# Patient Record
Sex: Male | Born: 1940 | Race: White | Hispanic: No | Marital: Married | Smoking: Never smoker
Health system: Southern US, Community
[De-identification: ages and names within clinical notes are randomized; demographics above are authoritative.]

## PROBLEM LIST (undated history)

## (undated) DIAGNOSIS — K219 Gastro-esophageal reflux disease without esophagitis: Secondary | ICD-10-CM

## (undated) DIAGNOSIS — H409 Unspecified glaucoma: Secondary | ICD-10-CM

## (undated) DIAGNOSIS — K589 Irritable bowel syndrome without diarrhea: Secondary | ICD-10-CM

## (undated) DIAGNOSIS — J449 Chronic obstructive pulmonary disease, unspecified: Secondary | ICD-10-CM

## (undated) DIAGNOSIS — J45909 Unspecified asthma, uncomplicated: Secondary | ICD-10-CM

## (undated) DIAGNOSIS — G629 Polyneuropathy, unspecified: Secondary | ICD-10-CM

## (undated) DIAGNOSIS — D1399 Benign neoplasm of ill-defined sites within the digestive system: Secondary | ICD-10-CM

## (undated) DIAGNOSIS — D139 Benign neoplasm of ill-defined sites within the digestive system: Secondary | ICD-10-CM

## (undated) HISTORY — DX: Unspecified glaucoma: H40.9

## (undated) HISTORY — DX: Unspecified asthma, uncomplicated: J45.909

## (undated) HISTORY — DX: Chronic obstructive pulmonary disease, unspecified: J44.9

## (undated) HISTORY — PX: OTHER SURGICAL HISTORY: SHX169

## (undated) HISTORY — DX: Polyneuropathy, unspecified: G62.9

## (undated) HISTORY — DX: Benign neoplasm of ill-defined sites within the digestive system: D13.99

## (undated) HISTORY — DX: Irritable bowel syndrome, unspecified: K58.9

## (undated) HISTORY — DX: Benign neoplasm of ill-defined sites within the digestive system: D13.9

## (undated) HISTORY — DX: Gastro-esophageal reflux disease without esophagitis: K21.9

---

## 1999-03-21 ENCOUNTER — Encounter: Payer: Self-pay | Admitting: Specialist

## 1999-03-21 ENCOUNTER — Ambulatory Visit (HOSPITAL_COMMUNITY): Admission: RE | Admit: 1999-03-21 | Discharge: 1999-03-21 | Payer: Self-pay | Admitting: Specialist

## 2005-01-14 ENCOUNTER — Ambulatory Visit: Payer: Self-pay | Admitting: Internal Medicine

## 2005-02-25 ENCOUNTER — Ambulatory Visit: Payer: Self-pay | Admitting: Internal Medicine

## 2005-05-02 ENCOUNTER — Ambulatory Visit (HOSPITAL_COMMUNITY): Admission: RE | Admit: 2005-05-02 | Discharge: 2005-05-02 | Payer: Self-pay | Admitting: *Deleted

## 2005-06-06 ENCOUNTER — Ambulatory Visit: Payer: Self-pay | Admitting: Internal Medicine

## 2005-09-02 ENCOUNTER — Ambulatory Visit (HOSPITAL_COMMUNITY): Admission: RE | Admit: 2005-09-02 | Discharge: 2005-09-02 | Payer: Self-pay | Admitting: Internal Medicine

## 2005-10-20 DIAGNOSIS — K589 Irritable bowel syndrome without diarrhea: Secondary | ICD-10-CM

## 2005-10-21 ENCOUNTER — Ambulatory Visit: Payer: Self-pay | Admitting: Internal Medicine

## 2005-10-29 ENCOUNTER — Ambulatory Visit: Payer: Self-pay | Admitting: Gastroenterology

## 2005-11-05 ENCOUNTER — Ambulatory Visit: Payer: Self-pay | Admitting: Gastroenterology

## 2005-11-19 ENCOUNTER — Encounter (INDEPENDENT_AMBULATORY_CARE_PROVIDER_SITE_OTHER): Payer: Self-pay | Admitting: *Deleted

## 2005-11-19 ENCOUNTER — Ambulatory Visit: Payer: Self-pay | Admitting: Gastroenterology

## 2005-11-19 DIAGNOSIS — D139 Benign neoplasm of ill-defined sites within the digestive system: Secondary | ICD-10-CM

## 2006-12-04 ENCOUNTER — Ambulatory Visit (HOSPITAL_COMMUNITY): Admission: RE | Admit: 2006-12-04 | Discharge: 2006-12-04 | Payer: Self-pay | Admitting: Internal Medicine

## 2007-03-13 ENCOUNTER — Encounter: Admission: RE | Admit: 2007-03-13 | Discharge: 2007-03-13 | Payer: Self-pay | Admitting: Neurosurgery

## 2007-06-23 ENCOUNTER — Encounter: Admission: RE | Admit: 2007-06-23 | Discharge: 2007-06-23 | Payer: Self-pay | Admitting: Neurosurgery

## 2008-10-07 ENCOUNTER — Telehealth: Payer: Self-pay | Admitting: Gastroenterology

## 2008-10-14 ENCOUNTER — Telehealth: Payer: Self-pay | Admitting: Gastroenterology

## 2008-10-14 ENCOUNTER — Ambulatory Visit: Payer: Self-pay | Admitting: Gastroenterology

## 2008-10-14 DIAGNOSIS — R079 Chest pain, unspecified: Secondary | ICD-10-CM

## 2008-10-14 DIAGNOSIS — J449 Chronic obstructive pulmonary disease, unspecified: Secondary | ICD-10-CM

## 2008-10-14 DIAGNOSIS — K219 Gastro-esophageal reflux disease without esophagitis: Secondary | ICD-10-CM | POA: Insufficient documentation

## 2008-10-14 DIAGNOSIS — J45909 Unspecified asthma, uncomplicated: Secondary | ICD-10-CM | POA: Insufficient documentation

## 2008-10-14 DIAGNOSIS — J4489 Other specified chronic obstructive pulmonary disease: Secondary | ICD-10-CM | POA: Insufficient documentation

## 2008-10-31 ENCOUNTER — Telehealth: Payer: Self-pay | Admitting: Gastroenterology

## 2008-11-01 ENCOUNTER — Ambulatory Visit: Payer: Self-pay | Admitting: Gastroenterology

## 2008-11-09 ENCOUNTER — Telehealth: Payer: Self-pay | Admitting: Gastroenterology

## 2008-11-23 ENCOUNTER — Telehealth: Payer: Self-pay | Admitting: Gastroenterology

## 2009-01-23 ENCOUNTER — Telehealth: Payer: Self-pay | Admitting: Gastroenterology

## 2009-01-27 ENCOUNTER — Ambulatory Visit: Payer: Self-pay | Admitting: Gastroenterology

## 2009-01-30 ENCOUNTER — Ambulatory Visit: Payer: Self-pay | Admitting: Gastroenterology

## 2009-01-30 ENCOUNTER — Telehealth: Payer: Self-pay | Admitting: Gastroenterology

## 2009-01-30 DIAGNOSIS — R142 Eructation: Secondary | ICD-10-CM

## 2009-01-30 DIAGNOSIS — R143 Flatulence: Secondary | ICD-10-CM

## 2009-01-30 DIAGNOSIS — R141 Gas pain: Secondary | ICD-10-CM | POA: Insufficient documentation

## 2009-01-31 ENCOUNTER — Encounter: Payer: Self-pay | Admitting: Gastroenterology

## 2009-09-29 ENCOUNTER — Telehealth: Payer: Self-pay | Admitting: Gastroenterology

## 2010-09-11 ENCOUNTER — Telehealth: Payer: Self-pay | Admitting: Gastroenterology

## 2010-11-29 ENCOUNTER — Telehealth: Payer: Self-pay | Admitting: Gastroenterology

## 2010-12-23 ENCOUNTER — Encounter: Payer: Self-pay | Admitting: Internal Medicine

## 2011-01-01 NOTE — Progress Notes (Signed)
Summary: Triage  Phone Note Call from Patient Call back at Home Phone [phone]   Caller: Patient Call For: Dr. 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 " States he wakes up at 2 AM feeling like he needs to have a BM but cannot go. C/O lower abdominal pain.. Patient  is using Omprazole daily, Bentyl QID as needed.Had a small BM this AM but has not been regular since he took the [employer]. Patient  states he has stopped taking the Equallactin. Instructed patient to try taking Miralax 17 grams this AM and repeat tonight if needed. Patient  will also try Gas-x for gas/bloating. Patient  to call back for further problems or increase in pain.  Jesse Fall, RN  Additional Follow-up for Phone Call Additional follow up Details #1::        ok Additional Follow-up by: Louis Meckel MD,  September 11, 2010 2:42 PM

## 2011-01-03 NOTE — Progress Notes (Signed)
Summary: Triage  Phone Note Call from Patient Call back at Home Phone [phone]   Caller: Patient Call For: Dr. Arlyce Dice Reason for Call: Talk to Nurse Summary of Call: having extreme gas buildup, abd pain Initial call taken by: Karna Christmas,  November 29, 2010 9:18 AM  Follow-up for Phone Call        Spoke with patient and he states he is having a lot of gas and some abdominal pain. Patient states that he is not taking gas-x, miralax, or the align. Instucted patient to use the gas-x for the gas and miralax to help him with bowel movements. Patient has been given all of these instructions in the past but is not following them. Mailed patient diet to follow again. Patient states he will try these recommendations and to call us back if symptoms persist. Follow-up by: Selinda Michaels RN,  November 29, 2010 9:48 AM

## 2011-02-12 ENCOUNTER — Telehealth: Payer: Self-pay | Admitting: Gastroenterology

## 2011-02-19 NOTE — Progress Notes (Signed)
Summary: Triage  Phone Note Call from Patient Call back at Home Phone [phone]   Caller: Marlin Canary Call For: Dr. Arlyce Dice Reason for Call: Talk to Nurse Summary of Call: Seen in ER last night and was DX w/Colitis. Was told to f/u in 1-2 days Initial call taken by: Karna Christmas,  February 12, 2011 8:34 AM  Follow-up for Phone Call        Spoke with patient and offered to make an appointment for the patient to see Mike Gip, PA, patient states he wants to see a "real doctor."Informed pt that this was the best option I have to offer him, they work closely with the physicians and can take care of his needs. Patient states he will call me back with what he wants to do. Follow-up by: Selinda Michaels RN,  February 12, 2011 9:21 AM

## 2011-03-07 ENCOUNTER — Ambulatory Visit (HOSPITAL_COMMUNITY)
Admission: RE | Admit: 2011-03-07 | Discharge: 2011-03-07 | Disposition: A | Discharge status: Home / Self Care | Payer: Medicare Other | Source: Ambulatory Visit | Attending: Physical Medicine and Rehabilitation | Admitting: Physical Medicine and Rehabilitation

## 2011-03-07 ENCOUNTER — Other Ambulatory Visit (HOSPITAL_COMMUNITY): Payer: Self-pay | Admitting: Physical Medicine and Rehabilitation

## 2011-03-07 DIAGNOSIS — Z0189 Encounter for other specified special examinations: Secondary | ICD-10-CM | POA: Insufficient documentation

## 2011-03-07 DIAGNOSIS — Z1810 Retained metal fragments, unspecified: Secondary | ICD-10-CM

## 2011-03-11 ENCOUNTER — Other Ambulatory Visit (HOSPITAL_COMMUNITY): Payer: Self-pay | Admitting: Specialist

## 2011-03-11 ENCOUNTER — Ambulatory Visit (HOSPITAL_COMMUNITY)
Admission: RE | Admit: 2011-03-11 | Discharge: 2011-03-11 | Disposition: A | Discharge status: Home / Self Care | Payer: Medicare Other | Source: Ambulatory Visit | Attending: Specialist | Admitting: Specialist

## 2011-03-11 DIAGNOSIS — Z1389 Encounter for screening for other disorder: Secondary | ICD-10-CM

## 2011-03-11 DIAGNOSIS — Z0189 Encounter for other specified special examinations: Secondary | ICD-10-CM | POA: Insufficient documentation

## 2012-03-24 DIAGNOSIS — R102 Pelvic and perineal pain: Secondary | ICD-10-CM | POA: Insufficient documentation

## 2012-05-26 IMAGING — CR DG ORBITS FOR FOREIGN BODY
2 series · 2 of 2 positions shown · non-contrast
Comparison: 03/07/2011

CLINICAL DATA: Pre MRI - history of metal exposure to eyes several
days ago

ORBITS FOR FOREIGN BODY - 2 VIEW

[w waters (1 of 2)]
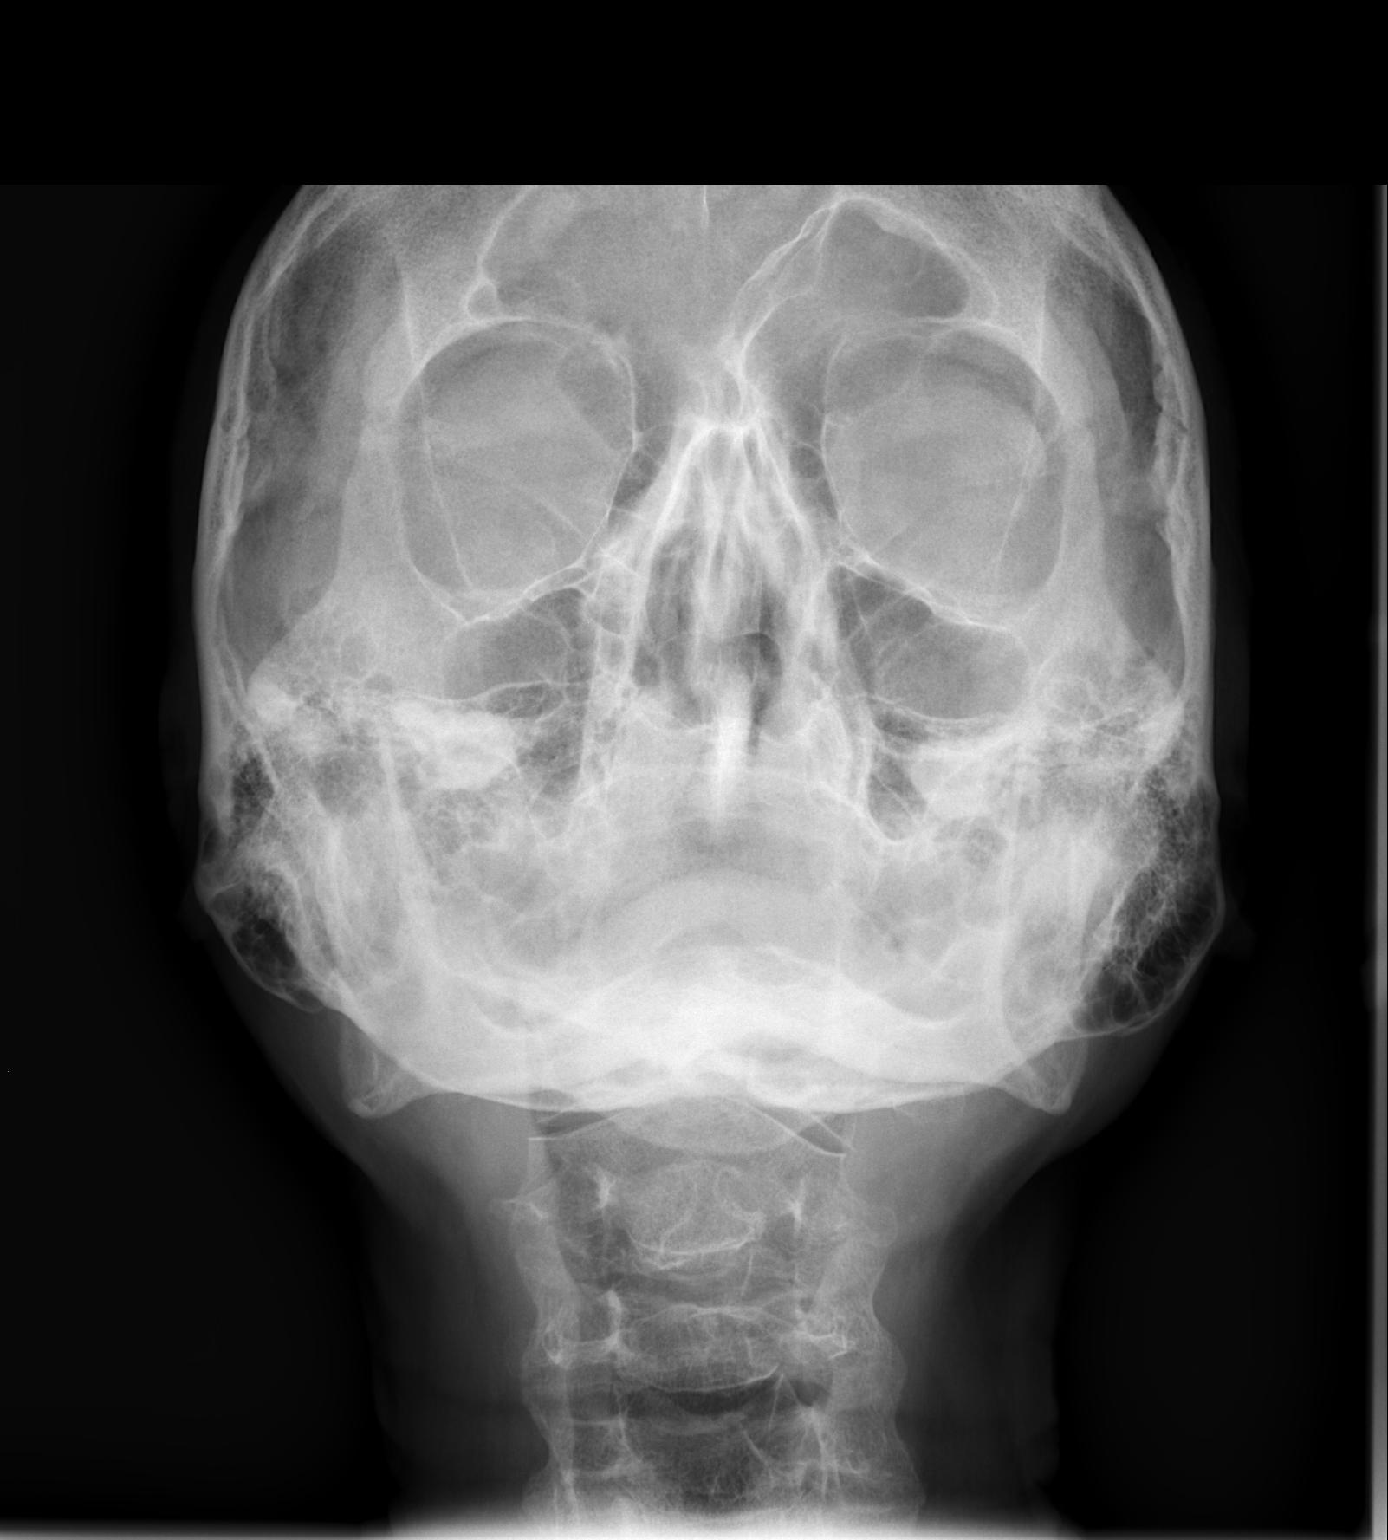

[w waters (2 of 2)]
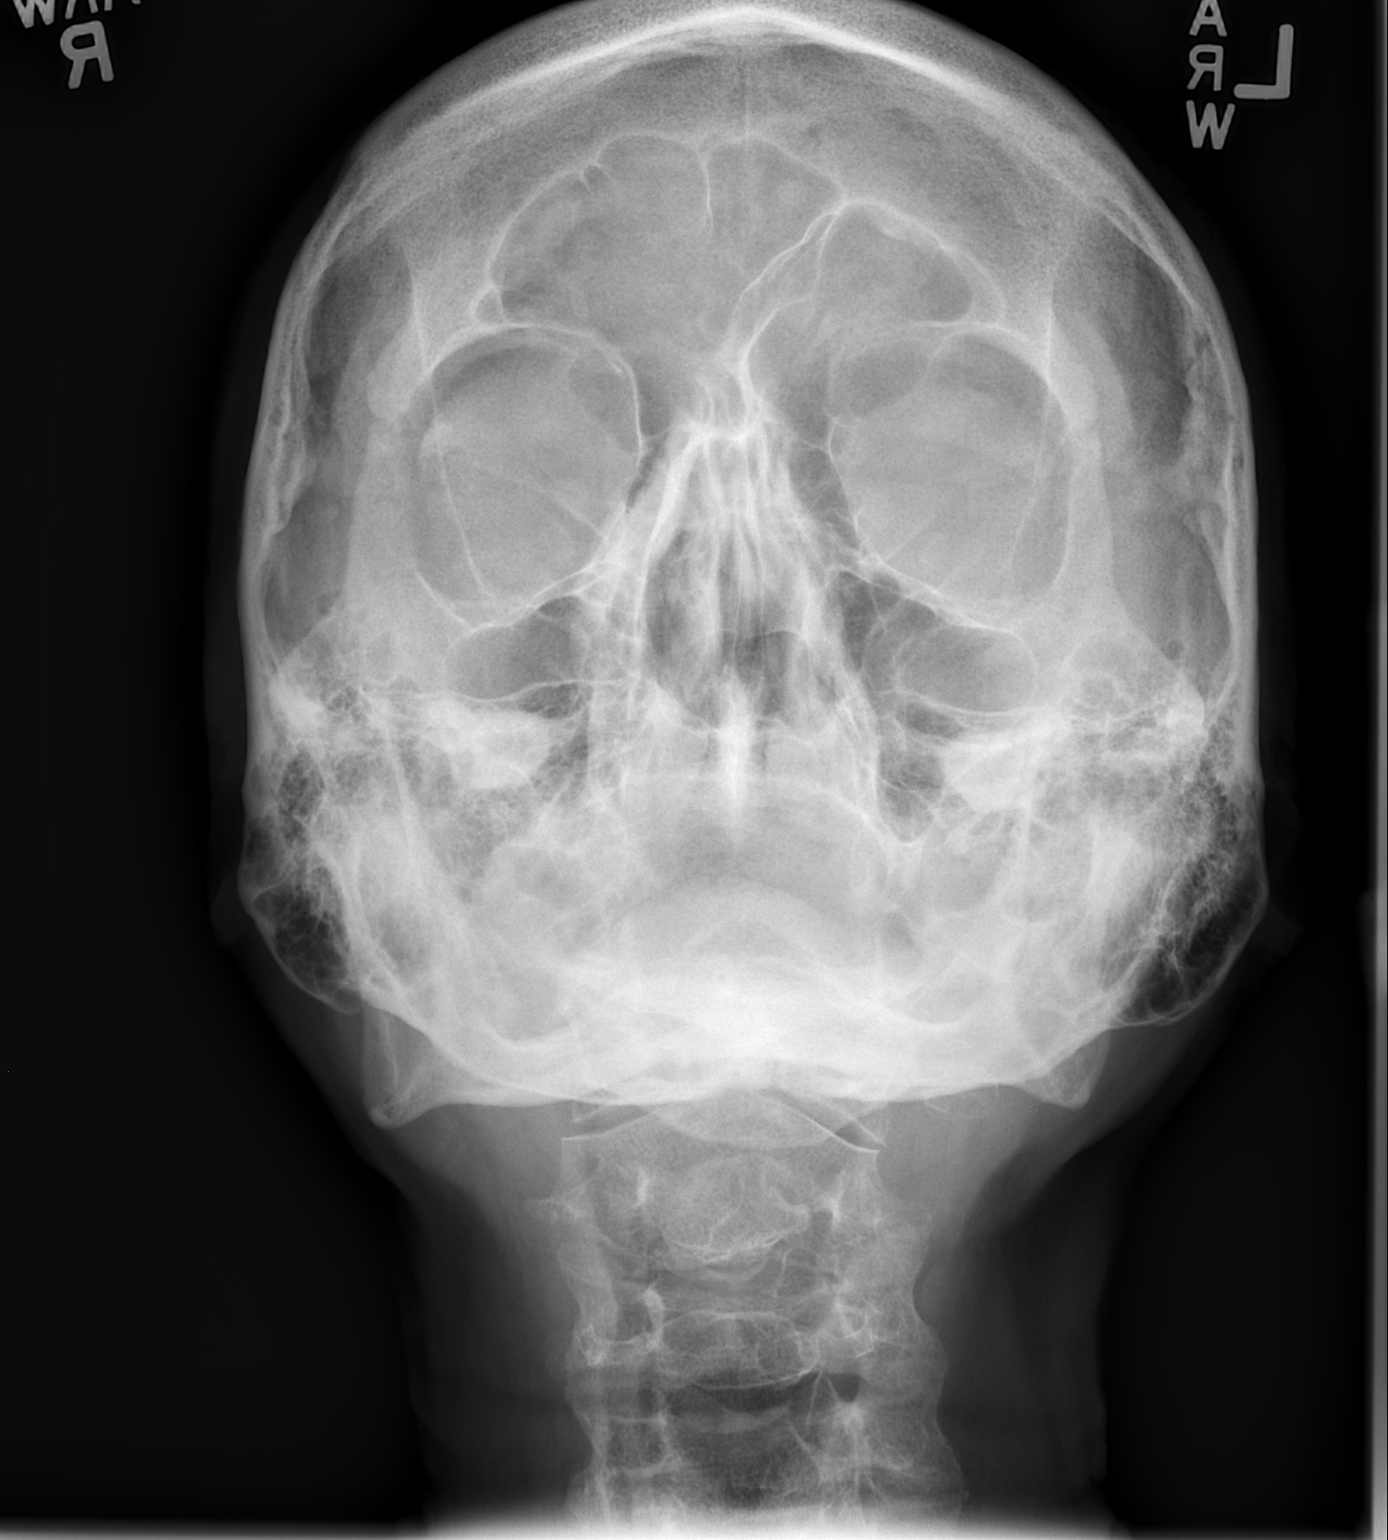

[2 of 2 positions shown; findings below may reference images not displayed]

FINDINGS: No radiopaque foreign body projects over either orbit. No
other pathological findings.
IMPRESSION: No evidence of metallic density foreign body to preclude MRI
scanning.

## 2013-06-10 ENCOUNTER — Telehealth: Payer: Self-pay | Admitting: Gastroenterology

## 2013-06-10 NOTE — Telephone Encounter (Signed)
Pt states he has been eating a lot of tomatoes and nuts and for the past few days he's had right side abdominal pain. Last COLON 11/01/08 showed diverticula in ascending and sigmoid areas. Pt denies diarrhea or a fever. Pt will see Willette Cluster, NP tomorrow.

## 2013-06-11 ENCOUNTER — Other Ambulatory Visit (INDEPENDENT_AMBULATORY_CARE_PROVIDER_SITE_OTHER): Payer: Medicare Other

## 2013-06-11 ENCOUNTER — Telehealth: Payer: Self-pay | Admitting: Nurse Practitioner

## 2013-06-11 ENCOUNTER — Ambulatory Visit (INDEPENDENT_AMBULATORY_CARE_PROVIDER_SITE_OTHER): Payer: Medicare Other | Admitting: Nurse Practitioner

## 2013-06-11 ENCOUNTER — Encounter: Payer: Self-pay | Admitting: Nurse Practitioner

## 2013-06-11 VITALS — BP 100/60 | HR 78 | Ht 70.0 in | Wt 172.0 lb

## 2013-06-11 DIAGNOSIS — G8929 Other chronic pain: Secondary | ICD-10-CM

## 2013-06-11 DIAGNOSIS — R1031 Right lower quadrant pain: Secondary | ICD-10-CM

## 2013-06-11 DIAGNOSIS — K219 Gastro-esophageal reflux disease without esophagitis: Secondary | ICD-10-CM

## 2013-06-11 LAB — CBC WITH DIFFERENTIAL/PLATELET
Eosinophils Absolute: 0.1 10*3/uL (ref 0.0–0.7)
Hemoglobin: 15.7 g/dL (ref 13.0–17.0)
Lymphs Abs: 2 10*3/uL (ref 0.7–4.0)
MCHC: 33.6 g/dL (ref 30.0–36.0)
MCV: 99.9 fl (ref 78.0–100.0)
Monocytes Absolute: 0.5 10*3/uL (ref 0.1–1.0)
Monocytes Relative: 11.9 % (ref 3.0–12.0)
Neutrophils Relative %: 40.8 % — ABNORMAL LOW (ref 43.0–77.0)
Platelets: 154 10*3/uL (ref 150.0–400.0)
RBC: 4.67 Mil/uL (ref 4.22–5.81)
RDW: 12.9 % (ref 11.5–14.6)

## 2013-06-11 MED ORDER — OMEPRAZOLE 20 MG PO CPDR: DELAYED_RELEASE_CAPSULE | ORAL | Status: DC

## 2013-06-11 MED ORDER — OMEPRAZOLE 20 MG PO CPDR: 20.0000 mg | DELAYED_RELEASE_CAPSULE | Freq: Every day | ORAL | Status: DC

## 2013-06-11 NOTE — Telephone Encounter (Signed)
Called patient and left a message for patient to call me back.  He had a question about prilosec.

## 2013-06-11 NOTE — Patient Instructions (Addendum)
Please go to the basement level to have your labs drawn. Start omeprazole once daily 30 minutes before dinner. We sent a prescription to [facility].  Hold milk of magnesia for 2 days to see if this helps loose stool. We made you a follow up appointment with Dr. Arlyce Dice for 07-14-2013 at 945 am. Call for sooner appointment if symptoms are not improving over next few days.  Trial of some simethicone ( Phazyme )  as directed. We have given you some coupons.  You can get this at any pharmacy, [facility], [address], [address], [employer].

## 2013-06-11 NOTE — Progress Notes (Signed)
HPI :  Patient is a 72 year old male known to Dr. Arlyce Dice though he has not been here in a few years. Patient previously evaluated for GERD as well as upper abdominal pain. He had an EGD December 2009 for evaluation of dysphasia and chest pain. Findings included a distal esophageal stricture which was dilated with a Maloney dilator. Exam was otherwise unremarkable. His last colonoscopy, done at time of EGD revealed only diverticulosis.  Patient is here for evaluation of right-sided abdominal pain located just right of umbilicus. Pain started 4 days ago. It occurs mainly at night he doesn't really notice it during the day. He has had a "touch" of this pain before but not to this extent. Bowels have been loose over last few days. Ordinarily he has to take MOM to move his bowels which he is still taking at this point. Apetite is okay overall. Pain got worse after a tomato sandwhich, otherwise he has been able to eat regular diet without increased pain. No new medicines. No fever. No urinary symptoms other than occas hesitancy, on daily Flowmax.  He has tried Bentyl a couple of times but not really sure if it helped. He complains of excessive gas. Patient has been playing a lot of golf lately.   Past Medical History  Diagnosis Date  . Irritable bowel syndrome   . GERD (gastroesophageal reflux disease)   . 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 COPD (chronic obstructive pulmonary disease)   . Unspecified asthma(493.90)    Family History  Problem Relation Age of Onset  . Heart disease    . Diabetes Mother    History  Substance Use Topics  . Smoking status: Never Smoker   . Smokeless tobacco: Never Used  . Alcohol Use: No   Current Outpatient Prescriptions  Medication Sig Dispense Refill  . aspirin 81 MG tablet Take 81 mg by mouth daily.      . diazepam (VALIUM) 5 MG tablet Take 5 mg by mouth every 12 (twelve) hours as needed for anxiety.      . dicyclomine  (BENTYL) 10 MG capsule Take 10 mg by mouth 4 (four) times daily -  before meals and at bedtime.      . fish oil-omega-3 fatty acids 1000 MG capsule Take 2 g by mouth daily.      Marland Kitchen gabapentin (NEURONTIN) 300 MG capsule Take 300 mg by mouth 3 (three) times daily.      Marland Kitchen HYDROcodone-acetaminophen (NORCO/VICODIN) 5-325 MG per tablet Take 1 tablet by mouth every 6 (six) hours as needed for pain.      . Multiple Vitamins-Minerals (CENTRUM SILVER PO) Take by mouth.      . tamsulosin (FLOMAX) 0.4 MG CAPS Take by mouth daily after supper.       No current facility-administered medications for this visit.    Review of Systems: Positive for allergies, anxiety, arthritis, back pain, fatigue, headaches, sleeping problems and hearing problems. All other  systems reviewed and negative except where noted in HPI.   Physical Exam: BP 100/60  Pulse 78  Ht 5\' 10"  (1.778 m)  Wt 172 lb (78.019 kg)  BMI 24.68 kg/m2  SpO2 98% Constitutional: Pleasant,well-developed, white male in no acute distress. HEENT: Normocephalic and atraumatic. Conjunctivae are normal. No scleral icterus. Neck supple.  Cardiovascular: Normal rate, regular rhythm.  Pulmonary/chest: Effort normal and breath sounds normal. No wheezing, rales or rhonchi. Abdominal: Soft, nondistended, nontender. Bowel sounds active throughout. There are  no masses palpable. No hepatomegaly. Negative carnett's sign.  Extremities: no edema Lymphadenopathy: No cervical adenopathy noted. Neurological: Alert and oriented to person place and time. Skin: Skin is warm and dry. No rashes noted. Psychiatric: Flat affect.   ASSESSMENT AND PLAN:  1. Right mid abdominal pain, not really related to meals or defecation. Etiology not clear if. Abdominal exam is unremarkable. Stools slightly more loose than normal. Pain maybe IBS related. Musculoskeletal pain not excluded. Abdominal exam benign. Check CBC today. Follow up with Dr. Arlyce Dice in 2-3 weeks. If pain worsens in  the interim patient will call the office for further testing.   2. GERD, off PPI for a year, now with intermittent heartburn at night. and occasional solid food dysphagia. Advised restarting Prilosec 30 minutes prior to dinner. Continue to elevate HOB,   3. Loose stool, hold MOM for a couple of days.    4. Excessive flatus, yesterday he started daily Activia. Trial of phazyme.

## 2013-06-11 NOTE — Telephone Encounter (Signed)
Per Logan Bores the patient can take 40 mg of Prilosec before dinner daily.  Also, per Gunnar Fusi the patient's labs ( CBC ) were normal.  I advised him of this.

## 2013-06-14 NOTE — Progress Notes (Signed)
Reviewed and agree with management. Saxon Barich D. Xoe Hoe, M.D., FACG  

## 2013-07-14 ENCOUNTER — Ambulatory Visit: Payer: Medicare Other | Admitting: Gastroenterology

## 2013-09-13 ENCOUNTER — Telehealth: Payer: Self-pay | Admitting: Gastroenterology

## 2013-09-13 NOTE — Telephone Encounter (Signed)
Pt states he has had some problems with acid reflux. Pt states he was taking omeprazole but stopped taking it. Instructed pt to resume taking the omeprazole and to call us back if that did not help. Pt verbalized understanding.

## 2013-10-20 ENCOUNTER — Telehealth: Payer: Self-pay | Admitting: Gastroenterology

## 2013-10-20 NOTE — Telephone Encounter (Signed)
Pt states he is having problems with lots of gas. Discussed with pt that he should be taking his prilosec daily and that he could try phazyme or gas-x. Pt would like to be seen by Dr. Arlyce Dice. Pt scheduled to see Dr. Arlyce Dice Friday at 10:45am. Pt aware of appt.

## 2013-10-22 ENCOUNTER — Ambulatory Visit: Payer: Medicare Other | Admitting: Gastroenterology

## 2013-12-10 ENCOUNTER — Encounter: Payer: Self-pay | Admitting: Gastroenterology

## 2013-12-10 ENCOUNTER — Telehealth: Payer: Self-pay | Admitting: Gastroenterology

## 2013-12-10 ENCOUNTER — Ambulatory Visit (INDEPENDENT_AMBULATORY_CARE_PROVIDER_SITE_OTHER): Payer: Medicare HMO | Admitting: Gastroenterology

## 2013-12-10 VITALS — BP 98/62 | HR 70 | Ht 70.0 in | Wt 159.8 lb

## 2013-12-10 DIAGNOSIS — R1031 Right lower quadrant pain: Secondary | ICD-10-CM

## 2013-12-10 DIAGNOSIS — K219 Gastro-esophageal reflux disease without esophagitis: Secondary | ICD-10-CM

## 2013-12-10 DIAGNOSIS — R109 Unspecified abdominal pain: Secondary | ICD-10-CM

## 2013-12-10 DIAGNOSIS — G8929 Other chronic pain: Secondary | ICD-10-CM

## 2013-12-10 NOTE — Assessment & Plan Note (Signed)
Pain is very nonspecific.  He describes this as different from his intermittent left lower quadrant pain that is responsive to Bentyl.  This is probably due to musculoskeletal pain.  Recommendations #1 stool Hemoccults #2 the patient was instructed to contact me if pain should worsen

## 2013-12-10 NOTE — Telephone Encounter (Signed)
Pt called and states that he forgot to mention at his office visit that at times he has a bad taste in his mouth and some nausea. Pt wanted to make sure he let Dr. Deatra Ina know. Dr. Deatra Ina notified.

## 2013-12-10 NOTE — Patient Instructions (Signed)
Go to the basement today for a Hemoccult kit  Follow up in one month  Use Zegerid OTC one every night at bedtime

## 2013-12-10 NOTE — Progress Notes (Signed)
          History of Present Illness:  Mr. Dennis Hoffman has returned for evaluation of right lower quadrant abdominal pain.  He has very mild, nonspecific discomfort in the right lower quadrant.  It is unrelated to moving his bowels or urinating.  There are no exacerbating or ameliorating factors.  He denies change in bowel habits or rectal bleeding.  He complains of nocturnal pyrosis.  He's been taking omeprazole and ranitidine.  He denies dysphagia.    Review of Systems: Pertinent positive and negative review of systems were noted in the above HPI section. All other review of systems were otherwise negative.    Current Medications, Allergies, Past Medical History, Past Surgical History, Family History and Social History were reviewed in [facility] record  Vital signs were reviewed in today's medical record. Physical Exam: General: Well developed , well nourished, no acute distress Skin: anicteric Head: Normocephalic and atraumatic Eyes:  sclerae anicteric, EOMI Ears: Normal auditory acuity Mouth: No deformity or lesions Lungs: Clear throughout to auscultation Heart: Regular rate and rhythm; no murmurs, rubs or bruits Abdomen: Soft, non tender and non distended. No masses, hepatosplenomegaly or hernias noted. Normal Bowel sounds Rectal:deferred Musculoskeletal: Symmetrical with no gross deformities  Pulses:  Normal pulses noted Extremities: No clubbing, cyanosis, edema or deformities noted Neurological: Alert oriented x 4, grossly nonfocal Psychological:  Alert and cooperative. Normal mood and affect  See Assessment and Plan under Problem List

## 2013-12-10 NOTE — Assessment & Plan Note (Signed)
He is moderately symptomatic, especially at night.  Recommendations #1 trial of Zegerid 40 mg each bedtime

## 2013-12-13 NOTE — Telephone Encounter (Signed)
Noted  

## 2013-12-14 ENCOUNTER — Other Ambulatory Visit (INDEPENDENT_AMBULATORY_CARE_PROVIDER_SITE_OTHER): Payer: Medicare HMO

## 2013-12-14 DIAGNOSIS — R109 Unspecified abdominal pain: Secondary | ICD-10-CM

## 2013-12-14 LAB — FECAL OCCULT BLOOD, IMMUNOCHEMICAL: Fecal Occult Bld: NEGATIVE

## 2013-12-15 NOTE — Progress Notes (Signed)
Quick Note:  Please inform the patient that hemeoccult was normal and to continue current plan of action ______

## 2014-08-05 ENCOUNTER — Encounter: Payer: Self-pay | Admitting: Gastroenterology

## 2014-11-30 ENCOUNTER — Ambulatory Visit (INDEPENDENT_AMBULATORY_CARE_PROVIDER_SITE_OTHER): Payer: Commercial Managed Care - HMO | Admitting: Neurology

## 2014-11-30 ENCOUNTER — Encounter: Payer: Self-pay | Admitting: Neurology

## 2014-11-30 VITALS — BP 109/60 | HR 83 | Ht 71.0 in | Wt 163.0 lb

## 2014-11-30 DIAGNOSIS — R519 Headache, unspecified: Secondary | ICD-10-CM | POA: Insufficient documentation

## 2014-11-30 DIAGNOSIS — R42 Dizziness and giddiness: Secondary | ICD-10-CM | POA: Insufficient documentation

## 2014-11-30 DIAGNOSIS — R51 Headache: Secondary | ICD-10-CM

## 2014-11-30 DIAGNOSIS — G44209 Tension-type headache, unspecified, not intractable: Secondary | ICD-10-CM

## 2014-11-30 MED ORDER — GABAPENTIN 300 MG PO CAPS: 300.0000 mg | ORAL_CAPSULE | Freq: Three times a day (TID) | ORAL | Status: DC

## 2014-11-30 MED ORDER — TRAMADOL HCL 50 MG PO TABS: 50.0000 mg | ORAL_TABLET | Freq: Two times a day (BID) | ORAL | Status: DC | PRN

## 2014-11-30 NOTE — Progress Notes (Signed)
PATIENT: Dennis Hoffman DOB: 1940/12/12  HISTORICAL  Dennis Hoffman is a 73 yo right-handed male, is referred by his primary care physician Dr. Delena Bali, accompanied by his wife for evaluation of new onset headaches, dizziness  In early December 2015, without clear trigger event, he noticed headaches, at bilateral supraorbital area, sometimes radiating to worse the right frontal, temporal region, pounding, pressure, lasting for few hours, temporary relief by ibuprofen, always come back later, he denies visual loss, he was recently diagnosed with left eye glaucoma, had a previous left eye injury in the past, is receiving eyedrops, but denies left visual loss,  In addition she complains of intermittent lightheadedness, especially when he is standing up, denies vertigo, he denies gait difficulty, no chewing difficulty, no diffuse muscle achy pain, no fever   REVIEW OF SYSTEMS: Full 14 system review of systems performed and notable only for eye pain, memory loss, headaches, dizziness, joints pain, allergy, constipation, urination problem, incontinence, hearing loss, ringing ears   ALLERGIES: No Known Allergies  HOME MEDICATIONS: Current Outpatient Prescriptions on File Prior to Visit  Medication Sig Dispense Refill  . aspirin 81 MG tablet Take 81 mg by mouth daily.    . diazepam (VALIUM) 5 MG tablet Take 5 mg by mouth every 12 (twelve) hours as needed for anxiety.    . dicyclomine (BENTYL) 10 MG capsule Take 10 mg by mouth 4 (four) times daily -  before meals and at bedtime.    . fish oil-omega-3 fatty acids 1000 MG capsule Take 2 g by mouth daily.    Marland Kitchen HYDROcodone-acetaminophen (NORCO/VICODIN) 5-325 MG per tablet Take 1 tablet by mouth every 6 (six) hours as needed for pain.    . Multiple Vitamins-Minerals (CENTRUM SILVER PO) Take by mouth.    Marland Kitchen omeprazole (PRILOSEC) 20 MG capsule Take 40 mg by mouth daily. Take 1 capsule before dinner.    . tamsulosin (FLOMAX) 0.4 MG CAPS Take by mouth  daily after supper.    . gabapentin (NEURONTIN) 300 MG capsule Take 300 mg by mouth 3 (three) times daily.     No current facility-administered medications on file prior to visit.    PAST MEDICAL HISTORY: Past Medical History  Diagnosis Date  . Irritable bowel syndrome   . GERD (gastroesophageal reflux disease)   . 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 Unspecified asthma(493.90)     PAST SURGICAL HISTORY: Past Surgical History  Procedure Laterality Date  . Back sugery    . Toe sugery      FAMILY HISTORY: Family History  Problem Relation Age of Onset  . Heart disease    . Diabetes Mother     SOCIAL HISTORY:  History   Social History  . Marital Status: Married    Spouse Name: N/A    Number of Children: 1  . Years of Education: N/A   Occupational History  . retired    Social History Main Topics  . Smoking status: Never Smoker   . Smokeless tobacco: Never Used  . Alcohol Use: No  . Drug Use: No  . Sexual Activity: Not on file   Other Topics Concern  . Not on file   Social History Narrative     PHYSICAL EXAM   Filed Vitals:   11/30/14 0917  BP: 109/60  Pulse: 83  Height: '5\' 11"'  (1.803 m)  Weight: 163 lb (73.936 kg)    Not recorded  Body mass index is 22.74 kg/(m^2).   Generalized: In no acute distress  Neck: Supple, no carotid bruits   Cardiac: Regular rate rhythm  Pulmonary: Clear to auscultation bilaterally  Musculoskeletal: No deformity  Neurological examination  Mentation: Alert oriented to time, place, history taking, and causual conversation  Cranial nerve II-XII: Pupils were equal round reactive to light. Extraocular movements were full.  Visual field were full on confrontational test. Bilateral fundi were sharp.  Facial sensation and strength were normal. Hearing was intact to finger rubbing bilaterally. Uvula tongue midline.  Head turning and shoulder shrug and were normal and  symmetric.Tongue protrusion into cheek strength was normal.  Motor: Normal tone, bulk and strength.  Sensory: Intact to fine touch, pinprick, preserved vibratory sensation, and proprioception at toes.  Coordination: Normal finger to nose, heel-to-shin bilaterally there was no truncal ataxia  Gait: Rising up from seated position without assistance, normal stance, without trunk ataxia, moderate stride, good arm swing, smooth turning, able to perform tiptoe, and heel walking without difficulty.   Romberg signs: Negative  Deep tendon reflexes: Brachioradialis 2/2, biceps 2/2, triceps 2/2, patellar 3/3, Achilles 2/2, plantar responses were flexor bilaterally.   DIAGNOSTIC DATA (LABS, IMAGING, TESTING) - I reviewed patient records, labs, notes, testing and imaging myself where available.  Lab Results  Component Value Date   WBC 4.4* 06/11/2013   HGB 15.7 06/11/2013   HCT 46.7 06/11/2013   MCV 99.9 06/11/2013   PLT 154.0 06/11/2013   ASSESSMENT AND PLAN  Dennis Hoffman is a 73 y.o. male presented with new onset headaches, dizziness, suggestive of orthostatic blood pressure changes, normal neurological examination, with exception of hyperreflexia of bilateral lower extremity  1, laboratory evaluations including ESR, C-reactive protein, to rule out temporal arteritis 2, MRI of brain 3. Refill gabapentin 300 mg 3 times a day, tramadol as needed 4, return to clinic in 3-4 weeks  Marcial Pacas, M.D. Ph.D.  [facility] [address] [place], [place] [address] [phone]

## 2014-12-01 LAB — CBC WITH DIFFERENTIAL
BASOS ABS: 0 10*3/uL (ref 0.0–0.2)
Basos: 1 %
EOS: 1 %
Eosinophils Absolute: 0 10*3/uL (ref 0.0–0.4)
HEMATOCRIT: 43.7 % (ref 37.5–51.0)
Hemoglobin: 15.2 g/dL (ref 12.6–17.7)
IMMATURE GRANS (ABS): 0 10*3/uL (ref 0.0–0.1)
IMMATURE GRANULOCYTES: 0 %
LYMPHS ABS: 2 10*3/uL (ref 0.7–3.1)
LYMPHS: 51 %
MCH: 32.7 pg (ref 26.6–33.0)
MCHC: 34.8 g/dL (ref 31.5–35.7)
MCV: 94 fL (ref 79–97)
MONOCYTES: 13 %
Monocytes Absolute: 0.5 10*3/uL (ref 0.1–0.9)
NEUTROS ABS: 1.4 10*3/uL (ref 1.4–7.0)
Neutrophils Relative %: 34 %
PLATELETS: 195 10*3/uL (ref 150–379)
RBC: 4.65 x10E6/uL (ref 4.14–5.80)
RDW: 12.7 % (ref 12.3–15.4)
WBC: 4 10*3/uL (ref 3.4–10.8)

## 2014-12-01 LAB — COMPREHENSIVE METABOLIC PANEL
A/G RATIO: 2 (ref 1.1–2.5)
ALK PHOS: 82 IU/L (ref 39–117)
ALT: 28 IU/L (ref 0–44)
AST: 27 IU/L (ref 0–40)
Albumin: 4.8 g/dL (ref 3.5–4.8)
BILIRUBIN TOTAL: 0.7 mg/dL (ref 0.0–1.2)
BUN/Creatinine Ratio: 14 (ref 10–22)
BUN: 12 mg/dL (ref 8–27)
CO2: 26 mmol/L (ref 18–29)
Calcium: 10.1 mg/dL (ref 8.6–10.2)
Chloride: 101 mmol/L (ref 97–108)
Creatinine, Ser: 0.85 mg/dL (ref 0.76–1.27)
GFR, EST AFRICAN AMERICAN: 101 mL/min/{1.73_m2} (ref >59)
GFR, EST NON AFRICAN AMERICAN: 87 mL/min/{1.73_m2} (ref >59)
Globulin, Total: 2.4 g/dL (ref 1.5–4.5)
Glucose: 90 mg/dL (ref 65–99)
POTASSIUM: 4.3 mmol/L (ref 3.5–5.2)
SODIUM: 142 mmol/L (ref 134–144)
Total Protein: 7.2 g/dL (ref 6.0–8.5)

## 2014-12-01 LAB — SEDIMENTATION RATE: SED RATE: 3 mm/h (ref 0–30)

## 2014-12-01 LAB — ANA W/REFLEX IF POSITIVE: ANA: NEGATIVE

## 2014-12-01 LAB — TSH: TSH: 1.73 u[IU]/mL (ref 0.450–4.500)

## 2014-12-01 LAB — C-REACTIVE PROTEIN: CRP: 0.1 mg/L (ref 0.0–4.9)

## 2014-12-05 NOTE — Progress Notes (Signed)
Quick Note:  Called and left patient a message normal labs. ______

## 2014-12-14 ENCOUNTER — Ambulatory Visit
Admission: RE | Admit: 2014-12-14 | Discharge: 2014-12-14 | Disposition: A | Discharge status: Home / Self Care | Payer: Medicare Other | Source: Ambulatory Visit | Attending: Neurology | Admitting: Neurology

## 2014-12-14 DIAGNOSIS — R42 Dizziness and giddiness: Secondary | ICD-10-CM

## 2014-12-14 DIAGNOSIS — G44209 Tension-type headache, unspecified, not intractable: Secondary | ICD-10-CM

## 2014-12-15 ENCOUNTER — Telehealth: Payer: Self-pay | Admitting: Neurology

## 2014-12-15 NOTE — Telephone Encounter (Signed)
Donna/Michelle:  Please call patient, mild age related MRI findings, no significant abnormalities  Please check also check on his headaches, dizziness, he should have a follow-up appointment with me in 3-4 weeks  Mildly abnormal MRI brain (without) demonstrating: 1. Minimal small foci of periventricular and subcortical T2 hyperintensities. These findings are non-specific and considerations include autoimmune, inflammatory, post-infectious, microvascular ischemic or migraine associated etiologies.  2. No acute findings. 3. 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

## 2014-12-15 NOTE — Telephone Encounter (Signed)
Left message

## 2014-12-16 NOTE — Telephone Encounter (Signed)
FYI Dr.Yan

## 2014-12-16 NOTE — Telephone Encounter (Signed)
Patient returning call regarding MRI results. He will be available to answer this morning at [phone]. Okay to leave a message at this number also.

## 2014-12-16 NOTE — Telephone Encounter (Signed)
Spoke to patient about MRI who verbalized understanding of results.  His dizziness has improved.  Daily headaches still present that vary in severity but responding well to at home treatment (Tramadol, NSAIDS).  Scheduled a follow up appointment with you on 01/09/14. Crissie Sickles

## 2015-01-02 DIAGNOSIS — H01021 Squamous blepharitis right upper eyelid: Secondary | ICD-10-CM | POA: Diagnosis not present

## 2015-01-02 DIAGNOSIS — H11153 Pinguecula, bilateral: Secondary | ICD-10-CM | POA: Diagnosis not present

## 2015-01-02 DIAGNOSIS — H4011X2 Primary open-angle glaucoma, moderate stage: Secondary | ICD-10-CM | POA: Diagnosis not present

## 2015-01-02 DIAGNOSIS — H01022 Squamous blepharitis right lower eyelid: Secondary | ICD-10-CM | POA: Diagnosis not present

## 2015-01-02 DIAGNOSIS — H10413 Chronic giant papillary conjunctivitis, bilateral: Secondary | ICD-10-CM | POA: Diagnosis not present

## 2015-01-09 ENCOUNTER — Ambulatory Visit: Payer: Self-pay | Admitting: Neurology

## 2015-01-20 DIAGNOSIS — M5136 Other intervertebral disc degeneration, lumbar region: Secondary | ICD-10-CM | POA: Diagnosis not present

## 2015-01-20 DIAGNOSIS — E785 Hyperlipidemia, unspecified: Secondary | ICD-10-CM | POA: Diagnosis not present

## 2015-01-20 DIAGNOSIS — Z0000 Encounter for general adult medical examination without abnormal findings: Secondary | ICD-10-CM | POA: Diagnosis not present

## 2015-01-20 DIAGNOSIS — Z1389 Encounter for screening for other disorder: Secondary | ICD-10-CM | POA: Diagnosis not present

## 2015-01-20 DIAGNOSIS — Z125 Encounter for screening for malignant neoplasm of prostate: Secondary | ICD-10-CM | POA: Diagnosis not present

## 2015-01-20 DIAGNOSIS — N400 Enlarged prostate without lower urinary tract symptoms: Secondary | ICD-10-CM | POA: Diagnosis not present

## 2015-01-20 DIAGNOSIS — Z9181 History of falling: Secondary | ICD-10-CM | POA: Diagnosis not present

## 2015-01-20 DIAGNOSIS — R9431 Abnormal electrocardiogram [ECG] [EKG]: Secondary | ICD-10-CM | POA: Diagnosis not present

## 2015-01-20 DIAGNOSIS — F419 Anxiety disorder, unspecified: Secondary | ICD-10-CM | POA: Diagnosis not present

## 2015-03-06 DIAGNOSIS — H1011 Acute atopic conjunctivitis, right eye: Secondary | ICD-10-CM | POA: Diagnosis not present

## 2015-04-26 DIAGNOSIS — G588 Other specified mononeuropathies: Secondary | ICD-10-CM | POA: Diagnosis not present

## 2015-05-17 DIAGNOSIS — G588 Other specified mononeuropathies: Secondary | ICD-10-CM | POA: Diagnosis not present

## 2015-05-17 DIAGNOSIS — N508 Other specified disorders of male genital organs: Secondary | ICD-10-CM | POA: Diagnosis not present

## 2015-07-18 ENCOUNTER — Other Ambulatory Visit: Payer: Self-pay | Admitting: Neurology

## 2015-08-02 DIAGNOSIS — F419 Anxiety disorder, unspecified: Secondary | ICD-10-CM | POA: Diagnosis not present

## 2015-08-02 DIAGNOSIS — Z23 Encounter for immunization: Secondary | ICD-10-CM | POA: Diagnosis not present

## 2015-08-02 DIAGNOSIS — Z139 Encounter for screening, unspecified: Secondary | ICD-10-CM | POA: Diagnosis not present

## 2015-08-02 DIAGNOSIS — K589 Irritable bowel syndrome without diarrhea: Secondary | ICD-10-CM | POA: Diagnosis not present

## 2015-08-02 DIAGNOSIS — M5136 Other intervertebral disc degeneration, lumbar region: Secondary | ICD-10-CM | POA: Diagnosis not present

## 2015-08-02 DIAGNOSIS — N400 Enlarged prostate without lower urinary tract symptoms: Secondary | ICD-10-CM | POA: Diagnosis not present

## 2015-08-02 DIAGNOSIS — Z9181 History of falling: Secondary | ICD-10-CM | POA: Diagnosis not present

## 2015-08-02 DIAGNOSIS — Z1389 Encounter for screening for other disorder: Secondary | ICD-10-CM | POA: Diagnosis not present

## 2015-08-04 DIAGNOSIS — E785 Hyperlipidemia, unspecified: Secondary | ICD-10-CM | POA: Diagnosis not present

## 2015-08-28 DIAGNOSIS — G588 Other specified mononeuropathies: Secondary | ICD-10-CM | POA: Diagnosis not present

## 2015-09-22 DIAGNOSIS — Z23 Encounter for immunization: Secondary | ICD-10-CM | POA: Diagnosis not present

## 2015-10-19 ENCOUNTER — Ambulatory Visit (INDEPENDENT_AMBULATORY_CARE_PROVIDER_SITE_OTHER): Payer: Medicare Other | Admitting: Allergy and Immunology

## 2015-10-19 ENCOUNTER — Encounter: Payer: Self-pay | Admitting: Allergy and Immunology

## 2015-10-19 VITALS — BP 108/68 | HR 88 | Temp 98.0°F | Resp 18 | Ht 69.29 in | Wt 170.9 lb

## 2015-10-19 DIAGNOSIS — H1010 Acute atopic conjunctivitis, unspecified eye: Secondary | ICD-10-CM

## 2015-10-19 DIAGNOSIS — J309 Allergic rhinitis, unspecified: Secondary | ICD-10-CM | POA: Diagnosis not present

## 2015-10-19 DIAGNOSIS — L718 Other rosacea: Secondary | ICD-10-CM | POA: Diagnosis not present

## 2015-10-19 MED ORDER — AZELASTINE-FLUTICASONE 137-50 MCG/ACT NA SUSP: 1.0000 | Freq: Two times a day (BID) | NASAL | Status: DC

## 2015-10-19 MED ORDER — DOXYCYCLINE HYCLATE 100 MG PO CAPS: 100.0000 mg | ORAL_CAPSULE | Freq: Every day | ORAL | Status: DC

## 2015-10-19 MED ORDER — AZELASTINE-FLUTICASONE 137-50 MCG/ACT NA SUSP: 1.0000 | Freq: Every day | NASAL | Status: DC

## 2015-10-19 NOTE — Progress Notes (Signed)
[place]    NEW PATIENT NOTE  Referring Provider: Nicoletta Dress, MD Primary Provider: Nicoletta Dress, MD    Subjective:   Patient ID: Dennis Hoffman is a 74 y.o. male with a chief complaint of Conjunctivitis and Nasal Congestion  and the following problems:  HPI Comments:  Dennis Hoffman presents this clinic in evaluation of allergies. He has a long history of allergies involving his nose with sneezing and nasal congestion especially following exposure to perfumes and dusts. What bothers him most recently is his left eye produces a lot of mucus. He seen a ophthalmologist about this issue and in fact has seen several ophthalmologists about this issue who have told him that he may have glaucoma. They've given him several eyedrops including pazeo eyedrops and prednisone eyedrops and Alaway eyedrops and he thinks that some of these do help somewhat. He does rub his eye on a pretty consistent basis. It should be noted that he complains a lot about his eyelashes being intensely itchy. He does get red face for a commonly. He does flush.   Past Medical History  Diagnosis Date  . Irritable bowel syndrome   . GERD (gastroesophageal reflux disease)   . 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 COPD (chronic obstructive pulmonary disease) ([place])   . Unspecified asthma(493.90)     Past Surgical History  Procedure Laterality Date  . Back sugery    . Toe sugery      Outpatient Encounter Prescriptions as of 10/19/2015  Medication Sig  . diazepam (VALIUM) 5 MG tablet Take 5 mg by mouth every 12 (twelve) hours as needed for anxiety.  . dicyclomine (BENTYL) 10 MG capsule Take 10 mg by mouth 4 (four) times daily -  before meals and at bedtime.  . fexofenadine (ALLEGRA) 180 MG tablet Take 180 mg by mouth as needed.  . fish oil-omega-3 fatty acids 1000 MG capsule Take 2 g by mouth daily.  . fluticasone  (FLONASE) 50 MCG/ACT nasal spray Place 1 spray into both nostrils daily.  Marland Kitchen gabapentin (NEURONTIN) 300 MG capsule TAKE ONE CAPSULE BY MOUTH THREE TIMES DAILY  . HYDROcodone-acetaminophen (NORCO/VICODIN) 5-325 MG per tablet Take 1 tablet by mouth every 6 (six) hours as needed for pain.  Marland Kitchen ketotifen (ALAWAY) 0.025 % ophthalmic solution 1 drop 2 (two) times daily.  . Multiple Vitamins-Minerals (CENTRUM SILVER PO) Take by mouth.  Marland Kitchen omeprazole (PRILOSEC) 20 MG capsule Take 40 mg by mouth daily. Take 1 capsule before dinner.  . ranitidine (ZANTAC) 150 MG tablet Take 150 mg by mouth 2 (two) times daily.  . tamsulosin (FLOMAX) 0.4 MG CAPS Take by mouth daily after supper.  Marland Kitchen aspirin 81 MG tablet Take 81 mg by mouth daily.  . bimatoprost (LUMIGAN) 0.01 % SOLN Place 1 drop into the left eye at bedtime.  . Hypromellose (SYSTANE OVERNIGHT THERAPY) 0.3 % GEL Apply to eye as needed.  . montelukast (SINGULAIR) 10 MG tablet Take 10 mg by mouth at bedtime.  . Olopatadine HCl (PAZEO) 0.7 % SOLN Apply to eye as directed.  . traMADol (ULTRAM) 50 MG tablet Take 1 tablet (50 mg total) by mouth every 12 (twelve) hours as needed. (Patient not taking: Reported on 10/19/2015)  . Travoprost, BAK Free, (TRAVATAN Z) 0.004 % SOLN ophthalmic solution Place 1 drop into the left eye at bedtime.   No facility-administered encounter medications on file as of 10/19/2015.  No orders of the defined types were placed in this encounter.    No Known Allergies  Review of Systems  Constitutional: Negative for fever and chills.  HENT: Positive for congestion and sneezing. Negative for ear pain, facial swelling, nosebleeds, postnasal drip, rhinorrhea, sinus pressure, sore throat, tinnitus, trouble swallowing and voice change.   Eyes: Positive for redness and itching. Negative for pain and discharge.  Respiratory: Negative for cough, choking, chest tightness, shortness of breath, wheezing and stridor.   Cardiovascular: Negative  for chest pain and leg swelling.  Gastrointestinal: Positive for abdominal pain. Negative for nausea and vomiting.       History of irritable bowel syndrome.  Endocrine: Negative for cold intolerance and heat intolerance.  Genitourinary: Negative for difficulty urinating.  Musculoskeletal: Positive for back pain. Negative for myalgias and arthralgias.       Persistent lower back pain from disc disease briefly treated with surgery  Allergic/Immunologic: Negative.   Neurological: Negative for dizziness.  Hematological: Negative for adenopathy.    Family History  Problem Relation Age of Onset  . Heart disease    . Diabetes Mother     Social History   Social History  . Marital Status: Married    Spouse Name: N/A  . Number of Children: N/A  . Years of Education: N/A   Occupational History  . retired    Social History Main Topics  . Smoking status: Never Smoker   . Smokeless tobacco: Never Used  . Alcohol Use: No  . Drug Use: No  . Sexual Activity: Not on file   Other Topics Concern  . Not on file   Social History Narrative    Environmental and Social history  Lives in a house with a dry environment, no animals located inside the household, no carpeting in the bedroom, sleeping on a stuff mattress without any plastic for the bed or pillows, and no smokers located inside the household.   Objective:   Filed Vitals:   10/19/15 0907  BP: 108/68  Pulse: 88  Temp: 98 F (36.7 C)  Resp: 18   Height: 5' 9.29" (176 cm) Weight: 170 lb 13.7 oz (77.5 kg)  Physical Exam  Constitutional: He appears well-developed and well-nourished. No distress.  Blowing nose  HENT:  Head: Normocephalic and atraumatic. Head is without right periorbital erythema and without left periorbital erythema.  Right Ear: Tympanic membrane, external ear and ear canal normal. No drainage or tenderness. No foreign bodies. Tympanic membrane is not injected, not scarred, not perforated, not erythematous,  not retracted and not bulging. No middle ear effusion.  Left Ear: Tympanic membrane, external ear and ear canal normal. No drainage or tenderness. No foreign bodies. Tympanic membrane is not injected, not scarred, not perforated, not erythematous, not retracted and not bulging.  No middle ear effusion.  Nose: Mucosal edema present. No rhinorrhea, nose lacerations or sinus tenderness.  No foreign bodies.  Mouth/Throat: Oropharynx is clear and moist. No oropharyngeal exudate, posterior oropharyngeal edema, posterior oropharyngeal erythema or tonsillar abscesses.  Eyes: Lids are normal. Right eye exhibits no chemosis, no discharge and no exudate. No foreign body present in the right eye. Left eye exhibits no chemosis, no discharge and no exudate. No foreign body present in the left eye. Right conjunctiva is injected. Left conjunctiva is injected.  Neck: Neck supple. No tracheal tenderness present. No tracheal deviation and no edema present. No thyroid mass and no thyromegaly present.  Cardiovascular: Normal rate, regular rhythm, S1 normal and S2 normal.  Exam reveals no gallop.   No murmur heard. Pulmonary/Chest: No accessory muscle usage or stridor. No respiratory distress. He has no wheezes. He has no rhonchi. He has no rales.  Abdominal: Soft. He exhibits no distension and no mass. There is no tenderness. There is no rebound and no guarding.  Lymphadenopathy:       Head (right side): No tonsillar adenopathy present.       Head (left side): No tonsillar adenopathy present.    He has no cervical adenopathy.  Neurological: He is alert.  Skin: Rash noted. He is not diaphoretic.  Diffuse facial erythema with slight induration and small gland ectasia and papule formation with a slightly bulbous nose  Psychiatric: He has a normal mood and affect. His behavior is normal.    Diagnostics:  Allergy skin tests were performed. He demonstrated hypersensitivity to dust mite, cat, molds, and red cedar  tree  Assessment and Plan:    1. Allergic rhinoconjunctivitis   2. Ocular rosacea      1. Allergen avoidance measures  2. Do not touch or rub eye  3. Doxycycline 100 mg one tablet one time per day  4. Try Dymista one spray each nostril one time per day. Replaces fluticasone spray  5. Can use OTC antihistamine  6. Can use OTC Systane eye drops or gel-drops several times per day  7. Return in 4 weeks  I will make the assumption that [name] has a combination of allergic rhinoconjunctivitis and ocular rosacea giving rise to his ranitidine and eye symptoms and treat him with the therapy mentioned above and have him return to this clinic in a possibly 4 weeks for an evaluation.    Allena Katz, MD [place]

## 2015-10-19 NOTE — Patient Instructions (Addendum)
  1. Allergen avoidance measures  2. Do not touch or rub eye  3. Doxycycline 100 mg one tablet one time per day  4. Try Dymista one spray each nostril two time per day. Replaces fluticasone spray  5. Can use OTC antihistamine  6. Can use OTC Systane eye drops or gel-drops several times per day  7. Return in 4 weeks

## 2015-10-20 ENCOUNTER — Other Ambulatory Visit: Payer: Self-pay | Admitting: *Deleted

## 2015-10-20 ENCOUNTER — Telehealth: Payer: Self-pay | Admitting: Allergy and Immunology

## 2015-10-20 MED ORDER — AZELASTINE HCL 0.15 % NA SOLN: 1.0000 | Freq: Two times a day (BID) | NASAL | Status: DC

## 2015-10-20 NOTE — Telephone Encounter (Signed)
Dr. Neldon Mc prescribed Dymista. It is not covered under his insurance. Needs something else called in that will be covered.  [facility] in [place]

## 2015-10-20 NOTE — Telephone Encounter (Signed)
LM FOR PATIENT ADVISING WILL SEND RX FOR AZELASTINE TO ADD TO HIS FLONASE ONE SPRAY EACH NOSTRIL TWICE DAILY OF BOTH, RX SENT

## 2015-10-21 DIAGNOSIS — M961 Postlaminectomy syndrome, not elsewhere classified: Secondary | ICD-10-CM | POA: Diagnosis not present

## 2015-10-21 DIAGNOSIS — M5136 Other intervertebral disc degeneration, lumbar region: Secondary | ICD-10-CM | POA: Diagnosis not present

## 2015-11-08 DIAGNOSIS — M5136 Other intervertebral disc degeneration, lumbar region: Secondary | ICD-10-CM | POA: Diagnosis not present

## 2015-11-20 ENCOUNTER — Ambulatory Visit: Payer: Medicare Other | Admitting: Allergy and Immunology

## 2015-12-11 ENCOUNTER — Ambulatory Visit (INDEPENDENT_AMBULATORY_CARE_PROVIDER_SITE_OTHER): Payer: Medicare Other | Admitting: Allergy and Immunology

## 2015-12-11 ENCOUNTER — Encounter: Payer: Self-pay | Admitting: Allergy and Immunology

## 2015-12-11 VITALS — BP 100/68 | HR 68 | Resp 16

## 2015-12-11 DIAGNOSIS — J309 Allergic rhinitis, unspecified: Secondary | ICD-10-CM | POA: Diagnosis not present

## 2015-12-11 DIAGNOSIS — H1010 Acute atopic conjunctivitis, unspecified eye: Secondary | ICD-10-CM

## 2015-12-11 DIAGNOSIS — L718 Other rosacea: Secondary | ICD-10-CM | POA: Diagnosis not present

## 2015-12-11 MED ORDER — METRONIDAZOLE 0.75 % EX CREA: TOPICAL_CREAM | CUTANEOUS | Status: DC

## 2015-12-11 NOTE — Progress Notes (Signed)
[facility]  Follow-up Note  Referring Provider: Nicoletta Dress, MD Primary Provider: Nicoletta Dress, MD Date of Office Visit: 12/11/2015  Subjective:   Dennis Hoffman is a 75 y.o. male who returns to the [place] in re-evaluation of the following:  HPI Comments:  Dennis Hoffman returns to this clinic on 12/11/2015 in reevaluation of his allergic rhinoconjunctivitis and ocular rosacea. He states that he is about 70% improved with a still has some problems on his left eye and still occasionally rubs his left eye. He stopped his doxycycline last week. He could not afford his Dymista and he's back on nasal fluticasone spray. He's had very little problems with his nose at this point. He did develop a headache over the course of the past 2 days that is apparently resolved today.   Current Outpatient Prescriptions on File Prior to Visit  Medication Sig Dispense Refill  . Azelastine HCl 0.15 % SOLN Place 1 spray into the nose 2 (two) times daily. 30 mL 5  . bimatoprost (LUMIGAN) 0.01 % SOLN Place 1 drop into the left eye at bedtime.    . diazepam (VALIUM) 5 MG tablet Take 5 mg by mouth every 12 (twelve) hours as needed for anxiety.    . dicyclomine (BENTYL) 10 MG capsule Take 10 mg by mouth 4 (four) times daily -  before meals and at bedtime.    . fexofenadine (ALLEGRA) 180 MG tablet Take 180 mg by mouth as needed.    . fish oil-omega-3 fatty acids 1000 MG capsule Take 2 g by mouth daily.    . fluticasone (FLONASE) 50 MCG/ACT nasal spray Place 1 spray into both nostrils daily.    Marland Kitchen gabapentin (NEURONTIN) 300 MG capsule TAKE ONE CAPSULE BY MOUTH THREE TIMES DAILY 30 capsule 0  . HYDROcodone-acetaminophen (NORCO/VICODIN) 5-325 MG per tablet Take 1 tablet by mouth every 6 (six) hours as needed for pain.    . Hypromellose (SYSTANE OVERNIGHT THERAPY) 0.3 % GEL Apply to eye as needed.    Marland Kitchen ketotifen (ALAWAY) 0.025 % ophthalmic  solution 1 drop 2 (two) times daily.    . Multiple Vitamins-Minerals (CENTRUM SILVER PO) Take by mouth.    Marland Kitchen omeprazole (PRILOSEC) 20 MG capsule Take 40 mg by mouth daily. Take 1 capsule before dinner.    . ranitidine (ZANTAC) 150 MG tablet Take 150 mg by mouth 2 (two) times daily.    . tamsulosin (FLOMAX) 0.4 MG CAPS Take by mouth daily after supper.    . Travoprost, BAK Free, (TRAVATAN Z) 0.004 % SOLN ophthalmic solution Place 1 drop into the left eye at bedtime.    Marland Kitchen aspirin 81 MG tablet Take 81 mg by mouth daily. Reported on 12/11/2015    . Azelastine-Fluticasone 137-50 MCG/ACT SUSP Place 1 spray into the nose 2 (two) times daily. (Patient not taking: Reported on 12/11/2015) 23 g 5  . doxycycline (VIBRAMYCIN) 100 MG capsule Take 1 capsule (100 mg total) by mouth daily. (Patient not taking: Reported on 12/11/2015) 30 capsule 3  . montelukast (SINGULAIR) 10 MG tablet Take 10 mg by mouth at bedtime. Reported on 12/11/2015    . Olopatadine HCl (PAZEO) 0.7 % SOLN Apply to eye as directed. Reported on 12/11/2015    . traMADol (ULTRAM) 50 MG tablet Take 1 tablet (50 mg total) by mouth every 12 (twelve) hours as needed. (Patient not taking: Reported on 10/19/2015) 60 tablet 5   No current facility-administered  medications on file prior to visit.    Meds ordered this encounter  Medications  . metroNIDAZOLE (METROCREAM) 0.75 % cream    Sig: Apply to face as directed two times a day.    Dispense:  45 g    Refill:  3    Past Medical History  Diagnosis Date  . Irritable bowel syndrome   . GERD (gastroesophageal reflux disease)   . 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 COPD (chronic obstructive pulmonary disease) ([place])   . Unspecified asthma(493.90)     Past Surgical History  Procedure Laterality Date  . Back sugery    . Toe sugery      No Known Allergies  Review of systems negative except as noted in HPI / PMHx or noted below:  Review of Systems   Constitutional: Negative.   HENT: Negative.   Eyes: Negative.   Respiratory: Negative.   Cardiovascular: Negative.   Gastrointestinal: Negative.   Genitourinary: Negative.   Musculoskeletal: Negative.   Skin: Negative.   Neurological: Negative.   Endo/Heme/Allergies: Negative.   Psychiatric/Behavioral: Negative.      Objective:   Filed Vitals:   12/11/15 1004  BP: 100/68  Pulse: 68  Resp: 16          Physical Exam  Constitutional: He is well-developed, well-nourished, and in no distress. No distress.  HENT:  Head: Normocephalic.  Right Ear: Tympanic membrane, external ear and ear canal normal.  Left Ear: Tympanic membrane, external ear and ear canal normal.  Nose: Nose normal. No mucosal edema or rhinorrhea.  Mouth/Throat: Uvula is midline, oropharynx is clear and moist and mucous membranes are normal. No oropharyngeal exudate.  Eyes: Conjunctivae are normal.  Neck: Trachea normal. No tracheal tenderness present. No tracheal deviation present. No thyromegaly present.  Cardiovascular: Normal rate, regular rhythm, S1 normal, S2 normal and normal heart sounds.   No murmur heard. Pulmonary/Chest: Breath sounds normal. No stridor. No respiratory distress. He has no wheezes. He has no rales.  Musculoskeletal: He exhibits no edema.  Lymphadenopathy:       Head (right side): No tonsillar adenopathy present.       Head (left side): No tonsillar adenopathy present.    He has no cervical adenopathy.    He has no axillary adenopathy.  Neurological: He is alert. Gait normal.  Skin: Rash (facial erythema) noted. He is not diaphoretic. No erythema. Nails show no clubbing.  Psychiatric: Mood and affect normal.    Diagnostics: None  Assessment and Plan:   1. Allergic rhinoconjunctivitis   2. Ocular rosacea      1. Allergen avoidance measures - DUST MITE  2. Do not touch or rub eye  3. Doxycycline 100 mg one tablet one time per day  4.  Continue fluticasone nasal  spray  5. Start metrocream apply to face two times per day  6. Can use OTC Systane eye drops or gel-drops and OTC antihistamine several times per day  7. Return in 12 weeks or earlier if problem  Dennis Hoffman is doing better and I've encouraged him to consistently use doxycycline and will add in MetroCream and see him back in this clinic and possibly 12 weeks to make a determination about how to proceed pending his response. I've also encouraged him to perform house dust avoidance measures especially with in his bedroom. He would be a candidate for immunotherapy if he fails medical therapy.  Allena Katz, MD [facility]

## 2015-12-11 NOTE — Patient Instructions (Signed)
  1. Allergen avoidance measures - DUST MITE  2. Do not touch or rub eye  3. Doxycycline 100 mg one tablet one time per day  4.  Continue fluticasone nasal spray  5. Start metrocream apply to face two times per day  6. Can use OTC Systane eye drops or gel-drops and OTC antihistamine several times per day  7. Return in 12 weeks or earlier if problem

## 2016-02-02 DIAGNOSIS — Z125 Encounter for screening for malignant neoplasm of prostate: Secondary | ICD-10-CM | POA: Diagnosis not present

## 2016-02-02 DIAGNOSIS — M5136 Other intervertebral disc degeneration, lumbar region: Secondary | ICD-10-CM | POA: Diagnosis not present

## 2016-02-02 DIAGNOSIS — K219 Gastro-esophageal reflux disease without esophagitis: Secondary | ICD-10-CM | POA: Diagnosis not present

## 2016-02-02 DIAGNOSIS — K581 Irritable bowel syndrome with constipation: Secondary | ICD-10-CM | POA: Diagnosis not present

## 2016-02-02 DIAGNOSIS — E785 Hyperlipidemia, unspecified: Secondary | ICD-10-CM | POA: Diagnosis not present

## 2016-02-02 DIAGNOSIS — N400 Enlarged prostate without lower urinary tract symptoms: Secondary | ICD-10-CM | POA: Diagnosis not present

## 2016-02-29 DIAGNOSIS — G588 Other specified mononeuropathies: Secondary | ICD-10-CM | POA: Diagnosis not present

## 2016-02-29 DIAGNOSIS — M5136 Other intervertebral disc degeneration, lumbar region: Secondary | ICD-10-CM | POA: Diagnosis not present

## 2016-02-29 DIAGNOSIS — R399 Unspecified symptoms and signs involving the genitourinary system: Secondary | ICD-10-CM | POA: Diagnosis not present

## 2016-02-29 DIAGNOSIS — J309 Allergic rhinitis, unspecified: Secondary | ICD-10-CM | POA: Diagnosis not present

## 2016-04-01 DIAGNOSIS — J45901 Unspecified asthma with (acute) exacerbation: Secondary | ICD-10-CM | POA: Diagnosis not present

## 2016-04-01 DIAGNOSIS — Z6824 Body mass index (BMI) 24.0-24.9, adult: Secondary | ICD-10-CM | POA: Diagnosis not present

## 2016-04-01 DIAGNOSIS — J309 Allergic rhinitis, unspecified: Secondary | ICD-10-CM | POA: Diagnosis not present

## 2016-04-10 DIAGNOSIS — K921 Melena: Secondary | ICD-10-CM | POA: Diagnosis not present

## 2016-04-10 DIAGNOSIS — K5792 Diverticulitis of intestine, part unspecified, without perforation or abscess without bleeding: Secondary | ICD-10-CM | POA: Diagnosis not present

## 2016-04-10 DIAGNOSIS — Z6824 Body mass index (BMI) 24.0-24.9, adult: Secondary | ICD-10-CM | POA: Diagnosis not present

## 2016-04-15 DIAGNOSIS — K219 Gastro-esophageal reflux disease without esophagitis: Secondary | ICD-10-CM | POA: Diagnosis not present

## 2016-06-18 DIAGNOSIS — L570 Actinic keratosis: Secondary | ICD-10-CM | POA: Diagnosis not present

## 2016-06-18 DIAGNOSIS — L578 Other skin changes due to chronic exposure to nonionizing radiation: Secondary | ICD-10-CM | POA: Diagnosis not present

## 2016-06-18 DIAGNOSIS — C44319 Basal cell carcinoma of skin of other parts of face: Secondary | ICD-10-CM | POA: Diagnosis not present

## 2016-06-26 DIAGNOSIS — B309 Viral conjunctivitis, unspecified: Secondary | ICD-10-CM | POA: Diagnosis not present

## 2016-06-28 DIAGNOSIS — J019 Acute sinusitis, unspecified: Secondary | ICD-10-CM | POA: Diagnosis not present

## 2016-06-28 DIAGNOSIS — Z6824 Body mass index (BMI) 24.0-24.9, adult: Secondary | ICD-10-CM | POA: Diagnosis not present

## 2016-06-28 DIAGNOSIS — J329 Chronic sinusitis, unspecified: Secondary | ICD-10-CM | POA: Diagnosis not present

## 2016-08-09 DIAGNOSIS — Z1389 Encounter for screening for other disorder: Secondary | ICD-10-CM | POA: Diagnosis not present

## 2016-08-09 DIAGNOSIS — Z139 Encounter for screening, unspecified: Secondary | ICD-10-CM | POA: Diagnosis not present

## 2016-08-09 DIAGNOSIS — K581 Irritable bowel syndrome with constipation: Secondary | ICD-10-CM | POA: Diagnosis not present

## 2016-08-09 DIAGNOSIS — Z9181 History of falling: Secondary | ICD-10-CM | POA: Diagnosis not present

## 2016-08-09 DIAGNOSIS — M4806 Spinal stenosis, lumbar region: Secondary | ICD-10-CM | POA: Diagnosis not present

## 2016-08-09 DIAGNOSIS — E785 Hyperlipidemia, unspecified: Secondary | ICD-10-CM | POA: Diagnosis not present

## 2016-08-09 DIAGNOSIS — N400 Enlarged prostate without lower urinary tract symptoms: Secondary | ICD-10-CM | POA: Diagnosis not present

## 2016-09-18 DIAGNOSIS — J45901 Unspecified asthma with (acute) exacerbation: Secondary | ICD-10-CM | POA: Diagnosis not present

## 2016-09-18 DIAGNOSIS — J3089 Other allergic rhinitis: Secondary | ICD-10-CM | POA: Diagnosis not present

## 2016-09-27 DIAGNOSIS — G588 Other specified mononeuropathies: Secondary | ICD-10-CM | POA: Diagnosis not present

## 2016-10-11 DIAGNOSIS — Z23 Encounter for immunization: Secondary | ICD-10-CM | POA: Diagnosis not present

## 2016-10-14 DIAGNOSIS — D1720 Benign lipomatous neoplasm of skin and subcutaneous tissue of unspecified limb: Secondary | ICD-10-CM | POA: Diagnosis not present

## 2016-10-14 DIAGNOSIS — J45901 Unspecified asthma with (acute) exacerbation: Secondary | ICD-10-CM | POA: Diagnosis not present

## 2016-10-14 DIAGNOSIS — Z6824 Body mass index (BMI) 24.0-24.9, adult: Secondary | ICD-10-CM | POA: Diagnosis not present

## 2016-11-14 ENCOUNTER — Ambulatory Visit (INDEPENDENT_AMBULATORY_CARE_PROVIDER_SITE_OTHER): Payer: Medicare Other | Admitting: Allergy and Immunology

## 2016-11-14 ENCOUNTER — Encounter: Payer: Self-pay | Admitting: Allergy and Immunology

## 2016-11-14 VITALS — BP 126/82 | HR 84 | Resp 16

## 2016-11-14 DIAGNOSIS — L718 Other rosacea: Secondary | ICD-10-CM | POA: Diagnosis not present

## 2016-11-14 DIAGNOSIS — K219 Gastro-esophageal reflux disease without esophagitis: Secondary | ICD-10-CM | POA: Diagnosis not present

## 2016-11-14 DIAGNOSIS — J3089 Other allergic rhinitis: Secondary | ICD-10-CM

## 2016-11-14 MED ORDER — OMEPRAZOLE 40 MG PO CPDR: 40.0000 mg | DELAYED_RELEASE_CAPSULE | Freq: Every day | ORAL | 3 refills | Status: DC

## 2016-11-14 NOTE — Patient Instructions (Addendum)
  1. Allergen avoidance measures - DUST MITE  2. Do not touch or rub eye  3. Continue fluticasone nasal spray and montelukast  4. Treat reflux with the following:   A. consolidate all caffeine and chocolate consumption  B. omeprazole 40 one tablet in a.m.  C. ranitidine 150 OTC 2 tablets in PM  5. Can use OTC Systane eye drops or gel-drops several times per day  6. Can use OTC Alaway eyedrops twice a day  7. Return in 4 weeks weeks or earlier if problem  8. Consider a course of immunotherapy

## 2016-11-14 NOTE — Progress Notes (Signed)
Follow-up Note  Referring Provider: Nicoletta Dress, MD Primary Provider: Nicoletta Dress, MD Date of Office Visit: 11/14/2016  Subjective:   Dennis Hoffman (DOB: May 03, 1941) is a 75 y.o. male who [facility]14/2016 in re-evaluation of the following:  HPI: Dennis Hoffman returns to this clinic in reevaluation of his allergic rhinoconjunctivitis and history of ocular rosacea. I last saw him in this clinic in January 2017.  Apparently he discontinued his doxycycline and MetroCream because he did not think that it helped him very much regarding his eye issue. As well, he does not really care what or how his skin is doing on his face. He continues to have significant issues with "allergies". His allergies at this point in time are for the most part postnasal drip. He has constant drainage down the back of his throat. Interestingly, he also has bad reflux with regurgitation up to his throat. He was on omeprazole years ago but for some reason discontinued this agent and now uses Zantac intermittently but he still continues to have problems. He was apparently given a pro-air inhaler for cough. He's not really sure this pro-air helps him very much. He still continues to have some intermittent eye irritation with involvement of his left eye more than his right eye and still has some issues with occasional nasal congestion although while using nasal steroid and montelukast his nose is actually under pretty good control.      Medication List      ALAWAY 0.025 % ophthalmic solution Generic drug:  ketotifen 1 drop 2 (two) times daily.   CENTRUM SILVER PO Take by mouth.   diazepam 5 MG tablet Commonly known as:  VALIUM Take 5 mg by mouth every 12 (twelve) hours as needed for anxiety.   dicyclomine 10 MG capsule Commonly known as:  BENTYL Take 10 mg by mouth 4 (four) times daily -  before meals and at bedtime.   fexofenadine 180 MG tablet Commonly known as:   ALLEGRA Take 180 mg by mouth as needed.   fish oil-omega-3 fatty acids 1000 MG capsule Take 2 g by mouth daily.   fluticasone 50 MCG/ACT nasal spray Commonly known as:  FLONASE Place 1 spray into both nostrils daily.   gabapentin 300 MG capsule Commonly known as:  NEURONTIN TAKE ONE CAPSULE BY MOUTH THREE TIMES DAILY   HYDROcodone-acetaminophen 5-325 MG tablet Commonly known as:  NORCO/VICODIN Take 1 tablet by mouth every 6 (six) hours as needed for pain.   montelukast 10 MG tablet Commonly known as:  SINGULAIR Take 10 mg by mouth at bedtime. Reported on 12/11/2015   omeprazole 20 MG capsule Commonly known as:  PRILOSEC Take 40 mg by mouth daily. Take 1 capsule before dinner.   ranitidine 150 MG tablet Commonly known as:  ZANTAC Take 150 mg by mouth 2 (two) times daily.   SYSTANE OVERNIGHT THERAPY 0.3 % Gel ophthalmic ointment Generic drug:  hypromellose Apply to eye as needed.   tamsulosin 0.4 MG Caps capsule Commonly known as:  FLOMAX Take by mouth daily after supper.       Past Medical History:  Diagnosis Date  . 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 COPD (chronic obstructive pulmonary disease) ([place])   . GERD (gastroesophageal reflux disease)   . Irritable bowel syndrome   . Unspecified asthma(493.90)     Past Surgical History:  Procedure Laterality Date  . back sugery    .  toe sugery      No Known Allergies  Review of systems negative except as noted in HPI / PMHx or noted below:  Review of Systems  Constitutional: Negative.   HENT: Negative.   Eyes: Negative.   Respiratory: Negative.   Cardiovascular: Negative.   Gastrointestinal: Negative.   Genitourinary: Negative.   Musculoskeletal: Negative.   Skin: Negative.   Neurological: Negative.   Endo/Heme/Allergies: Negative.   Psychiatric/Behavioral: Negative.      Objective:   Vitals:   11/14/16 1546  BP: 126/82  Pulse: 84  Resp: 16           Physical Exam  Constitutional: He is well-developed, well-nourished, and in no distress.  Slightly raspy voice, throat clearing  HENT:  Head: Normocephalic.  Right Ear: Tympanic membrane, external ear and ear canal normal.  Left Ear: Tympanic membrane, external ear and ear canal normal.  Nose: Nose normal. No mucosal edema or rhinorrhea.  Mouth/Throat: Uvula is midline, oropharynx is clear and moist and mucous membranes are normal. No oropharyngeal exudate.  Eyes: Conjunctivae are normal.  Neck: Trachea normal. No tracheal tenderness present. No tracheal deviation present. No thyromegaly present.  Cardiovascular: Normal rate, regular rhythm, S1 normal, S2 normal and normal heart sounds.   No murmur heard. Pulmonary/Chest: Breath sounds normal. No stridor. No respiratory distress. He has no wheezes. He has no rales.  Musculoskeletal: He exhibits no edema.  Lymphadenopathy:       Head (right side): No tonsillar adenopathy present.       Head (left side): No tonsillar adenopathy present.    He has no cervical adenopathy.  Neurological: He is alert. Gait normal.  Skin: Rash (slight facial erythema. Suggestion of rhinophyma) noted. He is not diaphoretic. No erythema. Nails show no clubbing.  Psychiatric: Mood and affect normal.    Diagnostics:    Spirometry was performed and demonstrated an FEV1 of 2.80 at 94 % of predicted.  The patient had an Asthma Control Test with the following results: ACT Total Score: 17.    Assessment and Plan:   1. LPRD (laryngopharyngeal reflux disease)   2. Other allergic rhinitis   3. Ocular rosacea     1. Allergen avoidance measures - DUST MITE  2. Do not touch or rub eye  3. Continue fluticasone nasal spray and montelukast  4. Treat reflux with the following:   A. consolidate all caffeine and chocolate consumption  B. omeprazole 40 one tablet in a.m.  C. ranitidine 150 OTC 2 tablets in PM  5. Can use OTC Systane eye drops or gel-drops  several times per day  6. Can use OTC Alaway eyedrops twice a day  7. Return in 4 weeks weeks or earlier if problem  8. Consider a course of immunotherapy  Dennis Hoffman has a history very consistent with LPR and we'll treat him aggressively for reflux as noted above and regroup with him in 4 weeks to assess his response. I suspect that his LPR is probably responsible for his cough as well. There may be a component of asthma but we will really not know that until we get his LPR cleared up. He has really bad allergies and he should consider a course immunotherapy and I have given him literature on this form of therapy during today's visit. We'll not treat his ocular rosacea at this point as he did not really find therapy to be particularly effective. Further evaluation and treatment will be based upon his response.  Allena Katz, MD [facility]

## 2016-11-21 ENCOUNTER — Encounter: Payer: Self-pay | Admitting: Allergy and Immunology

## 2017-01-15 DIAGNOSIS — H401122 Primary open-angle glaucoma, left eye, moderate stage: Secondary | ICD-10-CM | POA: Diagnosis not present

## 2017-01-15 DIAGNOSIS — G245 Blepharospasm: Secondary | ICD-10-CM | POA: Diagnosis not present

## 2017-01-23 ENCOUNTER — Other Ambulatory Visit: Payer: Self-pay | Admitting: Allergy and Immunology

## 2017-01-23 DIAGNOSIS — J3089 Other allergic rhinitis: Secondary | ICD-10-CM

## 2017-01-24 DIAGNOSIS — J301 Allergic rhinitis due to pollen: Secondary | ICD-10-CM | POA: Diagnosis not present

## 2017-02-07 DIAGNOSIS — N400 Enlarged prostate without lower urinary tract symptoms: Secondary | ICD-10-CM | POA: Diagnosis not present

## 2017-02-07 DIAGNOSIS — K581 Irritable bowel syndrome with constipation: Secondary | ICD-10-CM | POA: Diagnosis not present

## 2017-02-07 DIAGNOSIS — K219 Gastro-esophageal reflux disease without esophagitis: Secondary | ICD-10-CM | POA: Diagnosis not present

## 2017-02-07 DIAGNOSIS — M5137 Other intervertebral disc degeneration, lumbosacral region: Secondary | ICD-10-CM | POA: Diagnosis not present

## 2017-02-07 DIAGNOSIS — Z125 Encounter for screening for malignant neoplasm of prostate: Secondary | ICD-10-CM | POA: Diagnosis not present

## 2017-02-07 DIAGNOSIS — E785 Hyperlipidemia, unspecified: Secondary | ICD-10-CM | POA: Diagnosis not present

## 2017-02-10 ENCOUNTER — Ambulatory Visit: Payer: Self-pay

## 2017-02-10 ENCOUNTER — Ambulatory Visit (INDEPENDENT_AMBULATORY_CARE_PROVIDER_SITE_OTHER): Payer: Medicare Other | Admitting: *Deleted

## 2017-02-10 DIAGNOSIS — J309 Allergic rhinitis, unspecified: Secondary | ICD-10-CM

## 2017-02-10 MED ORDER — EPINEPHRINE 0.3 MG/0.3ML IJ SOAJ
0.3000 mg | Freq: Once | INTRAMUSCULAR | 1 refills | Status: AC
Start: 2017-02-10 — End: 2017-02-10

## 2017-02-10 NOTE — Progress Notes (Signed)
PATIENT STARTED ALLERGY INJS BLUE 1/100,000 AT .05 DOSAGE EACH DMITE-MOLD AND TREE-CAT REVIEWED SIDE EFFECTS, SCHEDULE B 1-2 TIMES WEEKLY, HOURS AND HOW/ WHEN TO USE EPIPEN RX SENT

## 2017-02-12 DIAGNOSIS — H401122 Primary open-angle glaucoma, left eye, moderate stage: Secondary | ICD-10-CM | POA: Diagnosis not present

## 2017-02-13 ENCOUNTER — Ambulatory Visit (INDEPENDENT_AMBULATORY_CARE_PROVIDER_SITE_OTHER): Payer: Medicare Other | Admitting: *Deleted

## 2017-02-13 DIAGNOSIS — J309 Allergic rhinitis, unspecified: Secondary | ICD-10-CM | POA: Diagnosis not present

## 2017-02-17 ENCOUNTER — Ambulatory Visit (INDEPENDENT_AMBULATORY_CARE_PROVIDER_SITE_OTHER): Payer: Medicare Other | Admitting: *Deleted

## 2017-02-17 DIAGNOSIS — J309 Allergic rhinitis, unspecified: Secondary | ICD-10-CM | POA: Diagnosis not present

## 2017-02-20 ENCOUNTER — Ambulatory Visit (INDEPENDENT_AMBULATORY_CARE_PROVIDER_SITE_OTHER): Payer: Medicare Other | Admitting: *Deleted

## 2017-02-20 DIAGNOSIS — J309 Allergic rhinitis, unspecified: Secondary | ICD-10-CM | POA: Diagnosis not present

## 2017-02-24 ENCOUNTER — Ambulatory Visit (INDEPENDENT_AMBULATORY_CARE_PROVIDER_SITE_OTHER): Payer: Medicare Other | Admitting: *Deleted

## 2017-02-24 DIAGNOSIS — J309 Allergic rhinitis, unspecified: Secondary | ICD-10-CM

## 2017-02-25 DIAGNOSIS — M5137 Other intervertebral disc degeneration, lumbosacral region: Secondary | ICD-10-CM | POA: Diagnosis not present

## 2017-02-26 DIAGNOSIS — M961 Postlaminectomy syndrome, not elsewhere classified: Secondary | ICD-10-CM | POA: Diagnosis not present

## 2017-02-27 ENCOUNTER — Ambulatory Visit (INDEPENDENT_AMBULATORY_CARE_PROVIDER_SITE_OTHER): Payer: Medicare Other | Admitting: *Deleted

## 2017-02-27 DIAGNOSIS — J309 Allergic rhinitis, unspecified: Secondary | ICD-10-CM | POA: Diagnosis not present

## 2017-03-03 ENCOUNTER — Ambulatory Visit (INDEPENDENT_AMBULATORY_CARE_PROVIDER_SITE_OTHER): Payer: Medicare Other | Admitting: *Deleted

## 2017-03-03 DIAGNOSIS — J309 Allergic rhinitis, unspecified: Secondary | ICD-10-CM | POA: Diagnosis not present

## 2017-03-06 ENCOUNTER — Ambulatory Visit (INDEPENDENT_AMBULATORY_CARE_PROVIDER_SITE_OTHER): Payer: Medicare Other | Admitting: *Deleted

## 2017-03-06 DIAGNOSIS — J309 Allergic rhinitis, unspecified: Secondary | ICD-10-CM

## 2017-03-10 ENCOUNTER — Ambulatory Visit (INDEPENDENT_AMBULATORY_CARE_PROVIDER_SITE_OTHER): Payer: Medicare Other | Admitting: *Deleted

## 2017-03-10 DIAGNOSIS — J309 Allergic rhinitis, unspecified: Secondary | ICD-10-CM

## 2017-03-13 ENCOUNTER — Ambulatory Visit (INDEPENDENT_AMBULATORY_CARE_PROVIDER_SITE_OTHER): Payer: Medicare Other | Admitting: *Deleted

## 2017-03-13 DIAGNOSIS — J309 Allergic rhinitis, unspecified: Secondary | ICD-10-CM | POA: Diagnosis not present

## 2017-03-17 ENCOUNTER — Ambulatory Visit (INDEPENDENT_AMBULATORY_CARE_PROVIDER_SITE_OTHER): Payer: Medicare Other | Admitting: *Deleted

## 2017-03-17 DIAGNOSIS — J309 Allergic rhinitis, unspecified: Secondary | ICD-10-CM

## 2017-03-19 DIAGNOSIS — M5137 Other intervertebral disc degeneration, lumbosacral region: Secondary | ICD-10-CM | POA: Diagnosis not present

## 2017-03-19 DIAGNOSIS — Z6824 Body mass index (BMI) 24.0-24.9, adult: Secondary | ICD-10-CM | POA: Diagnosis not present

## 2017-03-20 ENCOUNTER — Ambulatory Visit (INDEPENDENT_AMBULATORY_CARE_PROVIDER_SITE_OTHER): Payer: Medicare Other | Admitting: *Deleted

## 2017-03-20 DIAGNOSIS — J309 Allergic rhinitis, unspecified: Secondary | ICD-10-CM

## 2017-03-24 ENCOUNTER — Ambulatory Visit (INDEPENDENT_AMBULATORY_CARE_PROVIDER_SITE_OTHER): Payer: Medicare Other | Admitting: *Deleted

## 2017-03-24 DIAGNOSIS — J309 Allergic rhinitis, unspecified: Secondary | ICD-10-CM

## 2017-03-27 ENCOUNTER — Ambulatory Visit (INDEPENDENT_AMBULATORY_CARE_PROVIDER_SITE_OTHER): Payer: Medicare Other | Admitting: *Deleted

## 2017-03-27 DIAGNOSIS — J309 Allergic rhinitis, unspecified: Secondary | ICD-10-CM

## 2017-03-31 ENCOUNTER — Ambulatory Visit (INDEPENDENT_AMBULATORY_CARE_PROVIDER_SITE_OTHER): Payer: Medicare Other | Admitting: *Deleted

## 2017-03-31 DIAGNOSIS — J309 Allergic rhinitis, unspecified: Secondary | ICD-10-CM

## 2017-04-03 ENCOUNTER — Ambulatory Visit (INDEPENDENT_AMBULATORY_CARE_PROVIDER_SITE_OTHER): Payer: Medicare Other | Admitting: *Deleted

## 2017-04-03 DIAGNOSIS — J309 Allergic rhinitis, unspecified: Secondary | ICD-10-CM

## 2017-04-03 DIAGNOSIS — M5416 Radiculopathy, lumbar region: Secondary | ICD-10-CM | POA: Diagnosis not present

## 2017-04-04 DIAGNOSIS — M48061 Spinal stenosis, lumbar region without neurogenic claudication: Secondary | ICD-10-CM | POA: Diagnosis not present

## 2017-04-04 DIAGNOSIS — Z135 Encounter for screening for eye and ear disorders: Secondary | ICD-10-CM | POA: Diagnosis not present

## 2017-04-04 DIAGNOSIS — M48062 Spinal stenosis, lumbar region with neurogenic claudication: Secondary | ICD-10-CM | POA: Diagnosis not present

## 2017-04-04 DIAGNOSIS — R030 Elevated blood-pressure reading, without diagnosis of hypertension: Secondary | ICD-10-CM | POA: Diagnosis not present

## 2017-04-04 DIAGNOSIS — M5136 Other intervertebral disc degeneration, lumbar region: Secondary | ICD-10-CM | POA: Diagnosis not present

## 2017-04-04 DIAGNOSIS — M5416 Radiculopathy, lumbar region: Secondary | ICD-10-CM | POA: Diagnosis not present

## 2017-04-07 ENCOUNTER — Ambulatory Visit (INDEPENDENT_AMBULATORY_CARE_PROVIDER_SITE_OTHER): Payer: Medicare Other | Admitting: *Deleted

## 2017-04-07 DIAGNOSIS — J309 Allergic rhinitis, unspecified: Secondary | ICD-10-CM | POA: Diagnosis not present

## 2017-04-10 ENCOUNTER — Ambulatory Visit (INDEPENDENT_AMBULATORY_CARE_PROVIDER_SITE_OTHER): Payer: Medicare Other | Admitting: *Deleted

## 2017-04-10 DIAGNOSIS — J309 Allergic rhinitis, unspecified: Secondary | ICD-10-CM

## 2017-04-14 ENCOUNTER — Ambulatory Visit (INDEPENDENT_AMBULATORY_CARE_PROVIDER_SITE_OTHER): Payer: Medicare Other | Admitting: *Deleted

## 2017-04-14 DIAGNOSIS — J309 Allergic rhinitis, unspecified: Secondary | ICD-10-CM | POA: Diagnosis not present

## 2017-04-21 ENCOUNTER — Ambulatory Visit (INDEPENDENT_AMBULATORY_CARE_PROVIDER_SITE_OTHER): Payer: Medicare Other | Admitting: *Deleted

## 2017-04-21 DIAGNOSIS — J309 Allergic rhinitis, unspecified: Secondary | ICD-10-CM | POA: Diagnosis not present

## 2017-05-01 ENCOUNTER — Ambulatory Visit (INDEPENDENT_AMBULATORY_CARE_PROVIDER_SITE_OTHER): Payer: Medicare Other

## 2017-05-01 DIAGNOSIS — J309 Allergic rhinitis, unspecified: Secondary | ICD-10-CM | POA: Diagnosis not present

## 2017-05-05 ENCOUNTER — Ambulatory Visit (INDEPENDENT_AMBULATORY_CARE_PROVIDER_SITE_OTHER): Payer: Medicare Other | Admitting: *Deleted

## 2017-05-05 DIAGNOSIS — J309 Allergic rhinitis, unspecified: Secondary | ICD-10-CM | POA: Diagnosis not present

## 2017-05-07 DIAGNOSIS — R030 Elevated blood-pressure reading, without diagnosis of hypertension: Secondary | ICD-10-CM | POA: Diagnosis not present

## 2017-05-07 DIAGNOSIS — M48062 Spinal stenosis, lumbar region with neurogenic claudication: Secondary | ICD-10-CM | POA: Diagnosis not present

## 2017-05-12 ENCOUNTER — Ambulatory Visit (INDEPENDENT_AMBULATORY_CARE_PROVIDER_SITE_OTHER): Payer: Medicare Other | Admitting: *Deleted

## 2017-05-12 DIAGNOSIS — J309 Allergic rhinitis, unspecified: Secondary | ICD-10-CM

## 2017-05-19 ENCOUNTER — Ambulatory Visit (INDEPENDENT_AMBULATORY_CARE_PROVIDER_SITE_OTHER): Payer: Medicare Other | Admitting: *Deleted

## 2017-05-19 DIAGNOSIS — J309 Allergic rhinitis, unspecified: Secondary | ICD-10-CM

## 2017-05-21 DIAGNOSIS — H401122 Primary open-angle glaucoma, left eye, moderate stage: Secondary | ICD-10-CM | POA: Diagnosis not present

## 2017-05-26 ENCOUNTER — Ambulatory Visit (INDEPENDENT_AMBULATORY_CARE_PROVIDER_SITE_OTHER): Payer: Medicare Other | Admitting: *Deleted

## 2017-05-26 DIAGNOSIS — J309 Allergic rhinitis, unspecified: Secondary | ICD-10-CM | POA: Diagnosis not present

## 2017-06-02 ENCOUNTER — Ambulatory Visit (INDEPENDENT_AMBULATORY_CARE_PROVIDER_SITE_OTHER): Payer: Medicare Other | Admitting: *Deleted

## 2017-06-02 DIAGNOSIS — J309 Allergic rhinitis, unspecified: Secondary | ICD-10-CM

## 2017-06-09 ENCOUNTER — Ambulatory Visit (INDEPENDENT_AMBULATORY_CARE_PROVIDER_SITE_OTHER): Payer: Medicare Other

## 2017-06-09 DIAGNOSIS — J309 Allergic rhinitis, unspecified: Secondary | ICD-10-CM

## 2017-06-11 DIAGNOSIS — J3089 Other allergic rhinitis: Secondary | ICD-10-CM | POA: Diagnosis not present

## 2017-06-11 NOTE — Progress Notes (Signed)
VIALS EXP 06-12-18 

## 2017-06-12 DIAGNOSIS — J301 Allergic rhinitis due to pollen: Secondary | ICD-10-CM | POA: Diagnosis not present

## 2017-06-16 ENCOUNTER — Ambulatory Visit (INDEPENDENT_AMBULATORY_CARE_PROVIDER_SITE_OTHER): Payer: Medicare Other | Admitting: *Deleted

## 2017-06-16 DIAGNOSIS — J309 Allergic rhinitis, unspecified: Secondary | ICD-10-CM | POA: Diagnosis not present

## 2017-06-23 ENCOUNTER — Ambulatory Visit (INDEPENDENT_AMBULATORY_CARE_PROVIDER_SITE_OTHER): Payer: Medicare Other

## 2017-06-23 DIAGNOSIS — J309 Allergic rhinitis, unspecified: Secondary | ICD-10-CM | POA: Diagnosis not present

## 2017-06-30 ENCOUNTER — Ambulatory Visit (INDEPENDENT_AMBULATORY_CARE_PROVIDER_SITE_OTHER): Payer: Medicare Other | Admitting: *Deleted

## 2017-06-30 DIAGNOSIS — J309 Allergic rhinitis, unspecified: Secondary | ICD-10-CM

## 2017-07-02 DIAGNOSIS — J302 Other seasonal allergic rhinitis: Secondary | ICD-10-CM | POA: Diagnosis not present

## 2017-07-02 DIAGNOSIS — Z6824 Body mass index (BMI) 24.0-24.9, adult: Secondary | ICD-10-CM | POA: Diagnosis not present

## 2017-07-07 ENCOUNTER — Ambulatory Visit (INDEPENDENT_AMBULATORY_CARE_PROVIDER_SITE_OTHER): Payer: Medicare Other | Admitting: *Deleted

## 2017-07-07 DIAGNOSIS — J309 Allergic rhinitis, unspecified: Secondary | ICD-10-CM

## 2017-07-08 DIAGNOSIS — M48062 Spinal stenosis, lumbar region with neurogenic claudication: Secondary | ICD-10-CM | POA: Diagnosis not present

## 2017-07-08 DIAGNOSIS — M25551 Pain in right hip: Secondary | ICD-10-CM | POA: Diagnosis not present

## 2017-07-08 DIAGNOSIS — Z6824 Body mass index (BMI) 24.0-24.9, adult: Secondary | ICD-10-CM | POA: Diagnosis not present

## 2017-07-09 DIAGNOSIS — M48062 Spinal stenosis, lumbar region with neurogenic claudication: Secondary | ICD-10-CM | POA: Diagnosis not present

## 2017-07-09 DIAGNOSIS — M5416 Radiculopathy, lumbar region: Secondary | ICD-10-CM | POA: Diagnosis not present

## 2017-07-11 DIAGNOSIS — M25551 Pain in right hip: Secondary | ICD-10-CM | POA: Diagnosis not present

## 2017-07-14 ENCOUNTER — Ambulatory Visit (INDEPENDENT_AMBULATORY_CARE_PROVIDER_SITE_OTHER): Payer: Medicare Other | Admitting: *Deleted

## 2017-07-14 DIAGNOSIS — J309 Allergic rhinitis, unspecified: Secondary | ICD-10-CM | POA: Diagnosis not present

## 2017-07-16 DIAGNOSIS — M5416 Radiculopathy, lumbar region: Secondary | ICD-10-CM | POA: Diagnosis not present

## 2017-07-21 ENCOUNTER — Ambulatory Visit (INDEPENDENT_AMBULATORY_CARE_PROVIDER_SITE_OTHER): Payer: Medicare Other | Admitting: *Deleted

## 2017-07-21 DIAGNOSIS — J309 Allergic rhinitis, unspecified: Secondary | ICD-10-CM | POA: Diagnosis not present

## 2017-07-31 ENCOUNTER — Ambulatory Visit (INDEPENDENT_AMBULATORY_CARE_PROVIDER_SITE_OTHER): Payer: Medicare Other | Admitting: *Deleted

## 2017-07-31 DIAGNOSIS — J309 Allergic rhinitis, unspecified: Secondary | ICD-10-CM | POA: Diagnosis not present

## 2017-08-11 ENCOUNTER — Ambulatory Visit (INDEPENDENT_AMBULATORY_CARE_PROVIDER_SITE_OTHER): Payer: Medicare Other | Admitting: *Deleted

## 2017-08-11 DIAGNOSIS — J309 Allergic rhinitis, unspecified: Secondary | ICD-10-CM

## 2017-08-15 DIAGNOSIS — Z139 Encounter for screening, unspecified: Secondary | ICD-10-CM | POA: Diagnosis not present

## 2017-08-15 DIAGNOSIS — Z1389 Encounter for screening for other disorder: Secondary | ICD-10-CM | POA: Diagnosis not present

## 2017-08-15 DIAGNOSIS — Z9181 History of falling: Secondary | ICD-10-CM | POA: Diagnosis not present

## 2017-08-15 DIAGNOSIS — N400 Enlarged prostate without lower urinary tract symptoms: Secondary | ICD-10-CM | POA: Diagnosis not present

## 2017-08-15 DIAGNOSIS — Z0000 Encounter for general adult medical examination without abnormal findings: Secondary | ICD-10-CM | POA: Diagnosis not present

## 2017-08-15 DIAGNOSIS — M48062 Spinal stenosis, lumbar region with neurogenic claudication: Secondary | ICD-10-CM | POA: Diagnosis not present

## 2017-08-15 DIAGNOSIS — E785 Hyperlipidemia, unspecified: Secondary | ICD-10-CM | POA: Diagnosis not present

## 2017-08-18 DIAGNOSIS — G894 Chronic pain syndrome: Secondary | ICD-10-CM | POA: Diagnosis not present

## 2017-08-18 DIAGNOSIS — M961 Postlaminectomy syndrome, not elsewhere classified: Secondary | ICD-10-CM | POA: Diagnosis not present

## 2017-08-18 DIAGNOSIS — M48062 Spinal stenosis, lumbar region with neurogenic claudication: Secondary | ICD-10-CM | POA: Diagnosis not present

## 2017-08-19 DIAGNOSIS — G5603 Carpal tunnel syndrome, bilateral upper limbs: Secondary | ICD-10-CM | POA: Diagnosis not present

## 2017-08-19 DIAGNOSIS — J301 Allergic rhinitis due to pollen: Secondary | ICD-10-CM | POA: Diagnosis not present

## 2017-08-19 NOTE — Progress Notes (Signed)
VIALS EP 08-20-18

## 2017-08-20 DIAGNOSIS — J3089 Other allergic rhinitis: Secondary | ICD-10-CM | POA: Diagnosis not present

## 2017-08-25 ENCOUNTER — Ambulatory Visit (INDEPENDENT_AMBULATORY_CARE_PROVIDER_SITE_OTHER): Payer: Medicare Other | Admitting: *Deleted

## 2017-08-25 DIAGNOSIS — J309 Allergic rhinitis, unspecified: Secondary | ICD-10-CM

## 2017-09-01 ENCOUNTER — Ambulatory Visit (INDEPENDENT_AMBULATORY_CARE_PROVIDER_SITE_OTHER): Payer: Medicare Other | Admitting: *Deleted

## 2017-09-01 DIAGNOSIS — J309 Allergic rhinitis, unspecified: Secondary | ICD-10-CM | POA: Diagnosis not present

## 2017-09-08 ENCOUNTER — Ambulatory Visit (INDEPENDENT_AMBULATORY_CARE_PROVIDER_SITE_OTHER): Payer: Medicare Other | Admitting: *Deleted

## 2017-09-08 DIAGNOSIS — J309 Allergic rhinitis, unspecified: Secondary | ICD-10-CM | POA: Diagnosis not present

## 2017-09-12 DIAGNOSIS — Z23 Encounter for immunization: Secondary | ICD-10-CM | POA: Diagnosis not present

## 2017-09-15 ENCOUNTER — Ambulatory Visit (INDEPENDENT_AMBULATORY_CARE_PROVIDER_SITE_OTHER): Payer: Medicare Other | Admitting: *Deleted

## 2017-09-15 DIAGNOSIS — J309 Allergic rhinitis, unspecified: Secondary | ICD-10-CM | POA: Diagnosis not present

## 2017-09-19 DIAGNOSIS — H401122 Primary open-angle glaucoma, left eye, moderate stage: Secondary | ICD-10-CM | POA: Diagnosis not present

## 2017-09-22 ENCOUNTER — Ambulatory Visit (INDEPENDENT_AMBULATORY_CARE_PROVIDER_SITE_OTHER): Payer: Medicare Other | Admitting: *Deleted

## 2017-09-22 DIAGNOSIS — J309 Allergic rhinitis, unspecified: Secondary | ICD-10-CM

## 2017-09-29 ENCOUNTER — Ambulatory Visit (INDEPENDENT_AMBULATORY_CARE_PROVIDER_SITE_OTHER): Payer: Medicare Other | Admitting: *Deleted

## 2017-09-29 DIAGNOSIS — J309 Allergic rhinitis, unspecified: Secondary | ICD-10-CM

## 2017-10-02 DIAGNOSIS — J302 Other seasonal allergic rhinitis: Secondary | ICD-10-CM | POA: Diagnosis not present

## 2017-10-02 DIAGNOSIS — R030 Elevated blood-pressure reading, without diagnosis of hypertension: Secondary | ICD-10-CM | POA: Diagnosis not present

## 2017-10-06 ENCOUNTER — Ambulatory Visit (INDEPENDENT_AMBULATORY_CARE_PROVIDER_SITE_OTHER): Payer: Medicare Other | Admitting: *Deleted

## 2017-10-06 DIAGNOSIS — J309 Allergic rhinitis, unspecified: Secondary | ICD-10-CM

## 2017-10-13 ENCOUNTER — Ambulatory Visit (INDEPENDENT_AMBULATORY_CARE_PROVIDER_SITE_OTHER): Payer: Medicare Other | Admitting: *Deleted

## 2017-10-13 DIAGNOSIS — J309 Allergic rhinitis, unspecified: Secondary | ICD-10-CM

## 2017-10-20 ENCOUNTER — Ambulatory Visit (INDEPENDENT_AMBULATORY_CARE_PROVIDER_SITE_OTHER): Payer: Medicare Other

## 2017-10-20 DIAGNOSIS — J309 Allergic rhinitis, unspecified: Secondary | ICD-10-CM | POA: Diagnosis not present

## 2017-10-27 ENCOUNTER — Ambulatory Visit (INDEPENDENT_AMBULATORY_CARE_PROVIDER_SITE_OTHER): Payer: Medicare Other | Admitting: *Deleted

## 2017-10-27 DIAGNOSIS — J309 Allergic rhinitis, unspecified: Secondary | ICD-10-CM | POA: Diagnosis not present

## 2017-10-28 DIAGNOSIS — J3089 Other allergic rhinitis: Secondary | ICD-10-CM | POA: Diagnosis not present

## 2017-10-29 DIAGNOSIS — J301 Allergic rhinitis due to pollen: Secondary | ICD-10-CM | POA: Diagnosis not present

## 2017-10-29 DIAGNOSIS — M792 Neuralgia and neuritis, unspecified: Secondary | ICD-10-CM | POA: Diagnosis not present

## 2017-10-29 DIAGNOSIS — R102 Pelvic and perineal pain: Secondary | ICD-10-CM | POA: Diagnosis not present

## 2017-10-29 NOTE — Progress Notes (Signed)
VIALS 10-29-18

## 2017-11-06 DIAGNOSIS — I10 Essential (primary) hypertension: Secondary | ICD-10-CM | POA: Diagnosis not present

## 2017-11-06 DIAGNOSIS — M961 Postlaminectomy syndrome, not elsewhere classified: Secondary | ICD-10-CM | POA: Diagnosis not present

## 2017-11-11 ENCOUNTER — Ambulatory Visit (INDEPENDENT_AMBULATORY_CARE_PROVIDER_SITE_OTHER): Payer: Medicare Other | Admitting: *Deleted

## 2017-11-11 DIAGNOSIS — J309 Allergic rhinitis, unspecified: Secondary | ICD-10-CM | POA: Diagnosis not present

## 2017-11-17 DIAGNOSIS — M5126 Other intervertebral disc displacement, lumbar region: Secondary | ICD-10-CM | POA: Diagnosis not present

## 2017-11-17 DIAGNOSIS — M50223 Other cervical disc displacement at C6-C7 level: Secondary | ICD-10-CM | POA: Diagnosis not present

## 2017-11-17 DIAGNOSIS — M47812 Spondylosis without myelopathy or radiculopathy, cervical region: Secondary | ICD-10-CM | POA: Diagnosis not present

## 2017-11-17 DIAGNOSIS — M961 Postlaminectomy syndrome, not elsewhere classified: Secondary | ICD-10-CM | POA: Diagnosis not present

## 2017-11-17 DIAGNOSIS — M48061 Spinal stenosis, lumbar region without neurogenic claudication: Secondary | ICD-10-CM | POA: Diagnosis not present

## 2017-11-27 ENCOUNTER — Ambulatory Visit (INDEPENDENT_AMBULATORY_CARE_PROVIDER_SITE_OTHER): Payer: Medicare Other | Admitting: *Deleted

## 2017-11-27 DIAGNOSIS — J309 Allergic rhinitis, unspecified: Secondary | ICD-10-CM

## 2017-11-27 DIAGNOSIS — M5412 Radiculopathy, cervical region: Secondary | ICD-10-CM | POA: Diagnosis not present

## 2017-11-27 DIAGNOSIS — M9981 Other biomechanical lesions of cervical region: Secondary | ICD-10-CM | POA: Diagnosis not present

## 2017-11-27 DIAGNOSIS — G5623 Lesion of ulnar nerve, bilateral upper limbs: Secondary | ICD-10-CM | POA: Diagnosis not present

## 2017-11-27 DIAGNOSIS — R202 Paresthesia of skin: Secondary | ICD-10-CM | POA: Diagnosis not present

## 2017-11-27 DIAGNOSIS — G629 Polyneuropathy, unspecified: Secondary | ICD-10-CM | POA: Diagnosis not present

## 2017-12-09 DIAGNOSIS — G629 Polyneuropathy, unspecified: Secondary | ICD-10-CM | POA: Diagnosis not present

## 2017-12-11 ENCOUNTER — Ambulatory Visit (INDEPENDENT_AMBULATORY_CARE_PROVIDER_SITE_OTHER): Payer: Medicare Other | Admitting: *Deleted

## 2017-12-11 DIAGNOSIS — J309 Allergic rhinitis, unspecified: Secondary | ICD-10-CM | POA: Diagnosis not present

## 2017-12-22 ENCOUNTER — Ambulatory Visit (INDEPENDENT_AMBULATORY_CARE_PROVIDER_SITE_OTHER): Payer: Medicare Other

## 2017-12-22 DIAGNOSIS — J309 Allergic rhinitis, unspecified: Secondary | ICD-10-CM

## 2017-12-24 DIAGNOSIS — H401122 Primary open-angle glaucoma, left eye, moderate stage: Secondary | ICD-10-CM | POA: Diagnosis not present

## 2017-12-26 DIAGNOSIS — M4722 Other spondylosis with radiculopathy, cervical region: Secondary | ICD-10-CM | POA: Diagnosis not present

## 2017-12-31 ENCOUNTER — Ambulatory Visit: Payer: Medicare Other | Admitting: Neurology

## 2017-12-31 ENCOUNTER — Encounter: Payer: Self-pay | Admitting: Neurology

## 2017-12-31 ENCOUNTER — Encounter (INDEPENDENT_AMBULATORY_CARE_PROVIDER_SITE_OTHER): Payer: Self-pay

## 2017-12-31 VITALS — BP 113/68 | HR 68 | Ht 69.29 in | Wt 161.5 lb

## 2017-12-31 DIAGNOSIS — G8929 Other chronic pain: Secondary | ICD-10-CM

## 2017-12-31 DIAGNOSIS — M545 Low back pain, unspecified: Secondary | ICD-10-CM | POA: Insufficient documentation

## 2017-12-31 DIAGNOSIS — R202 Paresthesia of skin: Secondary | ICD-10-CM | POA: Diagnosis not present

## 2017-12-31 MED ORDER — GABAPENTIN 300 MG PO CAPS: 600.0000 mg | ORAL_CAPSULE | Freq: Three times a day (TID) | ORAL | 11 refills | Status: DC

## 2017-12-31 NOTE — Progress Notes (Addendum)
PATIENT: Dennis Hoffman DOB: 06-24-1941  Chief Complaint  Patient presents with  . Polyneuropathy    Reporting burning in the bottom of his feet and muscle weakness in arm/thighs.  He had a spinal cord stimulator trial 2-3 months ago.  He reports tingling in her bilateral hands since having this trial.  . Neurosurgeon    Dennis Larsson, MD - referring MD  . PCP    Dennis Dress, MD     HISTORICAL  Dennis Hoffman is a 77 year old male, seen in refer by neurosurgeon Dr. Earnie Hoffman, for evaluation of peripheral neuropathy, his primary care physician is Dr. Nicoletta Hoffman, initial evaluation was on December 31, 2017.  He is accompanied by his wife at today's clinical visit.   I reviewed and summarized the referring note, he had long history of chronic low back pain, which initially was helped by his lumbar decompression surgery in 1980s, he enjoys playing golf, but in the beginning of 2018, he began to experience worsening low back pain, radiating pain to right lateral leg, he was seen by neurosurgeon Dr. Earnie Hoffman,  Reviewed MRI of lumbar in May 2018: Left L45 postoperative left laminectomy, L3-4, mild subcuticular stenosis on the right  He was deemed not to be a surgical candidate, he has already failed medication management this including NSAIDs,  hydrocodone, gabapentin 300 mg 3 times a day, epidural injection, he was referred for a trial of spinal cord stimulator in September 2018,   I was able to review the procedure note dated August 18, 2017 by Dr. Maryjean Hoffman, 14-gauge [facility] Touhy needle was introduced and advanced under fluoroscopic guidance at T12-L1 level, along the anesthetized trajectory, and then use loss resistance technique as well as biplanar fluoroscopy the epidural space was entered, 16 contact linear lead were introduced, under fluroscopic guidance, the lead was advanced until its distal 3 contacts overlaid the inferior aspect of T7 with the rest of the  contact distributed,  across the T8, T9 vertebral bodies.  The day of the surgery after he was discharged, he noticed bilateral hand and distal arm paresthesia, subjective weakness.  The symptoms has been persistent since its onset, despite the spinal cord stimulator was taken off shortly afterwards.  He was referred for EMG nerve conduction study by Dr. Brien Hoffman, per report there was moderate right and mild left ulnar neuropathy at the elbow, there is a borderline low sensory amplitude involving both radial and medial sensory response, which raised the possibility of superimposed polyneuropathy  I also reviewed MRI of cervical spine in December 2018, multifocal disc degenerative changes, worse at C4-5, C5-6 level, complete disc space collapse, but no severe stenosis,  Repeat MRI of lumbar spine on November 17, 2017, new right foraminal protrusion at right L3-4 and coaching existing right L3 nerve roots, the disc also cause narrowing in the right subarticular recess.  He now complains of significant right-sided low back pain, radiating pain to right lateral leg, also have right ankle pain, he also reported long history of pelvic area pain, including bilateral testicular, penile, posterior thigh, over the years, was treated at [facility], recently at [facility] for potential pudendal nerve block.  He did not have significant improvement, but on his most recent trip to [facility], he received right lumbar epidural injection, which has helped his right low back pain, radiating pain to right lower extremity,  He does endorse symptoms of depression, mild gait abnormality due to pain, chronic constipation,  frequent urination, but no incontinence, denies significant bilateral lower extremity   REVIEW OF SYSTEMS: Full 14 system review of systems performed and notable only for fatigue, ringing the ears, hearing loss, itching, eye pain, constipation, urination problems, joint pain, achy muscles,  headache, weakness, dizziness  ALLERGIES:Pool, Mallie Mussel, MD,Schultz, Lora Havens, MD No Known Allergies  HOME MEDICATIONS: Current Outpatient Medications  Medication Sig Dispense Refill  . diazepam (VALIUM) 5 MG tablet Take 5 mg by mouth every 12 (twelve) hours as needed for anxiety.    . dicyclomine (BENTYL) 10 MG capsule Take 10 mg by mouth 4 (four) times daily -  before meals and at bedtime.    . fexofenadine (ALLEGRA) 180 MG tablet Take 180 mg by mouth as needed.    . fish oil-omega-3 fatty acids 1000 MG capsule Take 2 g by mouth daily.    . fluticasone (FLONASE) 50 MCG/ACT nasal spray Place 1 spray into both nostrils daily.    Marland Kitchen gabapentin (NEURONTIN) 300 MG capsule TAKE ONE CAPSULE BY MOUTH THREE TIMES DAILY 30 capsule 0  . HYDROcodone-acetaminophen (NORCO/VICODIN) 5-325 MG per tablet Take 1 tablet by mouth every 6 (six) hours as needed for pain.    . Hypromellose (SYSTANE OVERNIGHT THERAPY) 0.3 % GEL Apply to eye as needed.    Marland Kitchen ibuprofen (ADVIL,MOTRIN) 800 MG tablet Take 800 mg by mouth as needed.    Marland Kitchen ketotifen (ALAWAY) 0.025 % ophthalmic solution 1 drop 2 (two) times daily.    . montelukast (SINGULAIR) 10 MG tablet Take 10 mg by mouth at bedtime. Reported on 12/11/2015    . Multiple Vitamins-Minerals (CENTRUM SILVER PO) Take by mouth.    Marland Kitchen omeprazole (PRILOSEC) 40 MG capsule Take 1 capsule (40 mg total) by mouth daily. 90 capsule 3  . ranitidine (ZANTAC) 150 MG tablet Take 150 mg by mouth 2 (two) times daily.    . tamsulosin (FLOMAX) 0.4 MG CAPS Take by mouth daily after supper.     No current facility-administered medications for this visit.     PAST MEDICAL HISTORY: Past Medical History:  Diagnosis Date  . 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 COPD (chronic obstructive pulmonary disease) ([place])   . GERD (gastroesophageal reflux disease)   . Irritable bowel syndrome   . Polyneuropathy   . Unspecified asthma(493.90)     PAST SURGICAL  HISTORY: Past Surgical History:  Procedure Laterality Date  . back sugery    . toe sugery      FAMILY HISTORY: Family History  Problem Relation Age of Onset  . Diabetes Mother   . Other Father        "organs shut down"  . Heart disease Unknown     SOCIAL HISTORY:  Social History   Socioeconomic History  . Marital status: Married    Spouse name: Not on file  . Number of children: 1  . Years of education: 19  . Highest education level: High school graduate  Social Needs  . Financial resource strain: Not on file  . Food insecurity - worry: Not on file  . Food insecurity - inability: Not on file  . Transportation needs - medical: Not on file  . Transportation needs - non-medical: Not on file  Occupational History  . Occupation: retired  Tobacco Use  . Smoking status: Never Smoker  . Smokeless tobacco: Never Used  Substance and Sexual Activity  . Alcohol use: No  . Drug use: No  . Sexual activity:  Not on file  Other Topics Concern  . Not on file  Social History Narrative   Lives at home with his wife.   Right-handed.   1-2 cups caffeine per day.     PHYSICAL EXAM   Vitals:   12/31/17 1008  BP: 113/68  Pulse: 68  Weight: 161 lb 8 oz (73.3 kg)  Height: 5' 9.29" (1.76 m)    Not recorded      Body mass index is 23.65 kg/m.  PHYSICAL EXAMNIATION:  Gen: NAD, conversant, well nourised, obese, well groomed                     Cardiovascular: Regular rate rhythm, no peripheral edema, warm, nontender. Eyes: Conjunctivae clear without exudates or hemorrhage Neck: Supple, no carotid bruits. Pulmonary: Clear to auscultation bilaterally   NEUROLOGICAL EXAM:  MENTAL STATUS: Depressed looking elderly male Speech:    Speech is normal; fluent and spontaneous with normal comprehension.  Cognition:     Orientation to time, place and person     Normal recent and remote memory     Normal Attention span and concentration     Normal Language, naming,  repeating,spontaneous speech     Fund of knowledge   CRANIAL NERVES: CN II: Visual fields are full to confrontation. Fundoscopic exam is normal with sharp discs and no vascular changes. Pupils are round equal and briskly reactive to light. CN III, IV, VI: extraocular movement are normal. No ptosis. CN V: Facial sensation is intact to pinprick in all 3 divisions bilaterally. Corneal responses are intact.  CN VII: Face is symmetric with normal eye closure and smile. CN VIII: Hearing is normal to rubbing fingers CN IX, X: Palate elevates symmetrically. Phonation is normal. CN XI: Head turning and shoulder shrug are intact CN XII: Tongue is midline with normal movements and no atrophy.  MOTOR: There is no pronator drift of out-stretched arms. Muscle bulk and tone are normal. Muscle strength is normal.  REFLEXES: Reflexes are 2+ and symmetric at the biceps, triceps, 3 at bilateral knees, and trace at bilateral ankles. Plantar responses are flexor.  SENSORY: Intact to light touch, and vibratory sensation are intact in fingers and toes.  COORDINATION: Rapid alternating movements and fine finger movements are intact. There is no dysmetria on finger-to-nose and heel-knee-shin.    GAIT/STANCE: Posture is normal. Gait is steady with normal steps, base, arm swing, and turning. Heel and toe walking are normal. Tandem gait is normal.  Romberg is absent.   DIAGNOSTIC DATA (LABS, IMAGING, TESTING) - I reviewed patient records, labs, notes, testing and imaging myself where available.   ASSESSMENT AND PLAN  Azarion Hove Mcglaughlin is a 77 y.o. male   Right low back pain, radiating pain to right lower extremity,  Most consistent with right lumbar radiculopathy, which correlate with his most recent MRI spine findings in December 2018,  He wants second opinion to academic neurosurgeon, will refer him to [facility],  New onset bilateral upper extremity paresthesia in September 2018  I have suggested  repeat EMG nerve conduction study to further evaluate and localize his complaints, he refused it  Unsure etiology  Increase gabapentin to 300 mg 2 tablets 3 times a day,  MRI of thoracic spine,   Laboratory evaluations for treatable causes of peripheral neuropathy   Marcial Pacas, M.D. Ph.D.  [facility] [address] [place], [place] [address] Ph: [phone] Fax: [phone]  CC:  Dennis Larsson, MD,Schultz, Lora Havens,  MD

## 2018-01-02 ENCOUNTER — Telehealth: Payer: Self-pay | Admitting: *Deleted

## 2018-01-02 LAB — CBC WITH DIFFERENTIAL/PLATELET
BASOS ABS: 0 10*3/uL (ref 0.0–0.2)
Basos: 1 %
EOS (ABSOLUTE): 0.1 10*3/uL (ref 0.0–0.4)
EOS: 3 %
HEMOGLOBIN: 14.2 g/dL (ref 13.0–17.7)
Hematocrit: 43.4 % (ref 37.5–51.0)
IMMATURE GRANULOCYTES: 0 %
Immature Grans (Abs): 0 10*3/uL (ref 0.0–0.1)
LYMPHS ABS: 1.9 10*3/uL (ref 0.7–3.1)
Lymphs: 47 %
MCH: 32.3 pg (ref 26.6–33.0)
MCHC: 32.7 g/dL (ref 31.5–35.7)
MCV: 99 fL — ABNORMAL HIGH (ref 79–97)
MONOCYTES: 13 %
MONOS ABS: 0.5 10*3/uL (ref 0.1–0.9)
NEUTROS PCT: 36 %
Neutrophils Absolute: 1.4 10*3/uL (ref 1.4–7.0)
Platelets: 180 10*3/uL (ref 150–379)
RBC: 4.4 x10E6/uL (ref 4.14–5.80)
RDW: 13.2 % (ref 12.3–15.4)
WBC: 3.9 10*3/uL (ref 3.4–10.8)

## 2018-01-02 LAB — COMPREHENSIVE METABOLIC PANEL
ALK PHOS: 78 IU/L (ref 39–117)
ALT: 21 IU/L (ref 0–44)
AST: 23 IU/L (ref 0–40)
Albumin/Globulin Ratio: 1.7 (ref 1.2–2.2)
Albumin: 4.5 g/dL (ref 3.5–4.8)
BUN/Creatinine Ratio: 14 (ref 10–24)
BUN: 12 mg/dL (ref 8–27)
Bilirubin Total: 0.5 mg/dL (ref 0.0–1.2)
CALCIUM: 9.8 mg/dL (ref 8.6–10.2)
CO2: 26 mmol/L (ref 20–29)
CREATININE: 0.83 mg/dL (ref 0.76–1.27)
Chloride: 103 mmol/L (ref 96–106)
GFR calc Af Amer: 100 mL/min/{1.73_m2} (ref >59)
GFR, EST NON AFRICAN AMERICAN: 86 mL/min/{1.73_m2} (ref >59)
GLUCOSE: 84 mg/dL (ref 65–99)
Globulin, Total: 2.6 g/dL (ref 1.5–4.5)
Potassium: 4.8 mmol/L (ref 3.5–5.2)
Sodium: 144 mmol/L (ref 134–144)
Total Protein: 7.1 g/dL (ref 6.0–8.5)

## 2018-01-02 LAB — ANA W/REFLEX: ANA: NEGATIVE

## 2018-01-02 LAB — VITAMIN D 25 HYDROXY (VIT D DEFICIENCY, FRACTURES): VIT D 25 HYDROXY: 41.1 ng/mL (ref 30.0–100.0)

## 2018-01-02 LAB — C-REACTIVE PROTEIN: CRP: <0.3 mg/L (ref 0.0–4.9)

## 2018-01-02 LAB — FOLATE: Folate: >20 ng/mL (ref >3.0)

## 2018-01-02 LAB — RPR: RPR: NONREACTIVE

## 2018-01-02 LAB — TSH: TSH: 1.92 u[IU]/mL (ref 0.450–4.500)

## 2018-01-02 LAB — HGB A1C W/O EAG: HEMOGLOBIN A1C: 5.5 % (ref 4.8–5.6)

## 2018-01-02 LAB — VITAMIN B12: VITAMIN B 12: 762 pg/mL (ref 232–1245)

## 2018-01-02 LAB — CK: Total CK: 47 U/L (ref 24–204)

## 2018-01-02 LAB — ACETYLCHOLINE RECEPTOR, BINDING

## 2018-01-02 LAB — FERRITIN: Ferritin: 121 ng/mL (ref 30–400)

## 2018-01-02 LAB — SEDIMENTATION RATE: Sed Rate: 2 mm/hr (ref 0–30)

## 2018-01-02 LAB — COPPER, SERUM: COPPER: 68 ug/dL — AB (ref 72–166)

## 2018-01-02 NOTE — Telephone Encounter (Signed)
-----   Message from Marcial Pacas, MD sent at 01/02/2018  8:55 AM EST ----- Please call patient for there was no significant abnormality on extensive laboratory evaluations

## 2018-01-02 NOTE — Telephone Encounter (Signed)
Left patient a detailed message, with results, on voicemail (ok per DPR).  Provided our number to call back with any questions.  

## 2018-01-05 ENCOUNTER — Ambulatory Visit (INDEPENDENT_AMBULATORY_CARE_PROVIDER_SITE_OTHER): Payer: Medicare Other | Admitting: *Deleted

## 2018-01-05 DIAGNOSIS — J309 Allergic rhinitis, unspecified: Secondary | ICD-10-CM | POA: Diagnosis not present

## 2018-01-06 ENCOUNTER — Telehealth: Payer: Self-pay | Admitting: *Deleted

## 2018-01-06 NOTE — Telephone Encounter (Signed)
Called with lab results; talked with wife on DPR. She stated they had already gotten a VM with results. She verbalized understanding of call.  01/02/18 Michelle RN LVM: Left patient a detailed message, with results, on voicemail (ok per DPR).  Provided our number to call back with any questions.

## 2018-01-19 ENCOUNTER — Ambulatory Visit (INDEPENDENT_AMBULATORY_CARE_PROVIDER_SITE_OTHER): Payer: Medicare Other | Admitting: *Deleted

## 2018-01-19 ENCOUNTER — Ambulatory Visit
Admission: RE | Admit: 2018-01-19 | Discharge: 2018-01-19 | Disposition: A | Discharge status: Home / Self Care | Payer: Medicare Other | Source: Ambulatory Visit | Attending: Neurology | Admitting: Neurology

## 2018-01-19 DIAGNOSIS — J309 Allergic rhinitis, unspecified: Secondary | ICD-10-CM | POA: Diagnosis not present

## 2018-01-19 DIAGNOSIS — M546 Pain in thoracic spine: Secondary | ICD-10-CM | POA: Diagnosis not present

## 2018-01-19 DIAGNOSIS — M545 Low back pain: Secondary | ICD-10-CM

## 2018-01-19 DIAGNOSIS — R202 Paresthesia of skin: Secondary | ICD-10-CM | POA: Diagnosis not present

## 2018-01-19 DIAGNOSIS — G8929 Other chronic pain: Secondary | ICD-10-CM

## 2018-01-20 ENCOUNTER — Telehealth: Payer: Self-pay | Admitting: Neurology

## 2018-01-20 DIAGNOSIS — M50321 Other cervical disc degeneration at C4-C5 level: Secondary | ICD-10-CM | POA: Diagnosis not present

## 2018-01-20 DIAGNOSIS — M50322 Other cervical disc degeneration at C5-C6 level: Secondary | ICD-10-CM | POA: Diagnosis not present

## 2018-01-20 DIAGNOSIS — M5117 Intervertebral disc disorders with radiculopathy, lumbosacral region: Secondary | ICD-10-CM | POA: Diagnosis not present

## 2018-01-20 DIAGNOSIS — M50323 Other cervical disc degeneration at C6-C7 level: Secondary | ICD-10-CM | POA: Diagnosis not present

## 2018-01-20 DIAGNOSIS — M5137 Other intervertebral disc degeneration, lumbosacral region: Secondary | ICD-10-CM | POA: Diagnosis not present

## 2018-01-20 DIAGNOSIS — M4696 Unspecified inflammatory spondylopathy, lumbar region: Secondary | ICD-10-CM | POA: Diagnosis not present

## 2018-01-20 DIAGNOSIS — M47812 Spondylosis without myelopathy or radiculopathy, cervical region: Secondary | ICD-10-CM | POA: Diagnosis not present

## 2018-01-20 DIAGNOSIS — M47897 Other spondylosis, lumbosacral region: Secondary | ICD-10-CM | POA: Diagnosis not present

## 2018-01-20 DIAGNOSIS — M4726 Other spondylosis with radiculopathy, lumbar region: Secondary | ICD-10-CM | POA: Diagnosis not present

## 2018-01-20 DIAGNOSIS — M533 Sacrococcygeal disorders, not elsewhere classified: Secondary | ICD-10-CM | POA: Diagnosis not present

## 2018-01-20 DIAGNOSIS — M438X6 Other specified deforming dorsopathies, lumbar region: Secondary | ICD-10-CM | POA: Diagnosis not present

## 2018-01-20 DIAGNOSIS — M4316 Spondylolisthesis, lumbar region: Secondary | ICD-10-CM | POA: Diagnosis not present

## 2018-01-20 DIAGNOSIS — M4186 Other forms of scoliosis, lumbar region: Secondary | ICD-10-CM | POA: Diagnosis not present

## 2018-01-20 NOTE — Telephone Encounter (Signed)
Left message requesting a return call.

## 2018-01-20 NOTE — Telephone Encounter (Signed)
Left another message for patient.

## 2018-01-20 NOTE — Telephone Encounter (Signed)
Please call patient, MRI of the thoracic spine did show a central herniated disc at the T 9 and 10, causing distortion of the thecal sac, but the adjacent spinal cord has normal signal.  I think this is an incidental finding, would not explain his symptoms of numbness tingling of bilateral upper and lower extremity.  We will review films at his next follow-up visit   IMPRESSION:  This MRI of the thoracic spine without contrast shows the following:  1.    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 The adjacent spinal cord has normal signal. There does not appear to be any nerve root impingement. 2.    There are milder disc disease and protrusions at C7-T1, T2-T3, T8-T9 and T10-T11 that do not lead to any spinal stenosis or nerve root compression. 3.    The spinal cord has normal signal.

## 2018-01-21 DIAGNOSIS — Z0000 Encounter for general adult medical examination without abnormal findings: Secondary | ICD-10-CM | POA: Diagnosis not present

## 2018-01-21 DIAGNOSIS — E785 Hyperlipidemia, unspecified: Secondary | ICD-10-CM | POA: Diagnosis not present

## 2018-01-21 DIAGNOSIS — Z1331 Encounter for screening for depression: Secondary | ICD-10-CM | POA: Diagnosis not present

## 2018-01-21 DIAGNOSIS — Z1389 Encounter for screening for other disorder: Secondary | ICD-10-CM | POA: Diagnosis not present

## 2018-01-21 DIAGNOSIS — Z9181 History of falling: Secondary | ICD-10-CM | POA: Diagnosis not present

## 2018-01-21 DIAGNOSIS — Z125 Encounter for screening for malignant neoplasm of prostate: Secondary | ICD-10-CM | POA: Diagnosis not present

## 2018-01-21 NOTE — Telephone Encounter (Signed)
Spoke to patient's wife on HIPAA - she is aware of MRI results and wrote them down for her husband.  Also, provided our number for him to call with any questions.

## 2018-02-02 DIAGNOSIS — H04123 Dry eye syndrome of bilateral lacrimal glands: Secondary | ICD-10-CM | POA: Diagnosis not present

## 2018-02-12 ENCOUNTER — Ambulatory Visit (INDEPENDENT_AMBULATORY_CARE_PROVIDER_SITE_OTHER): Payer: Medicare Other | Admitting: *Deleted

## 2018-02-12 DIAGNOSIS — J309 Allergic rhinitis, unspecified: Secondary | ICD-10-CM

## 2018-02-16 ENCOUNTER — Ambulatory Visit (INDEPENDENT_AMBULATORY_CARE_PROVIDER_SITE_OTHER): Payer: Medicare Other | Admitting: *Deleted

## 2018-02-16 DIAGNOSIS — Z125 Encounter for screening for malignant neoplasm of prostate: Secondary | ICD-10-CM | POA: Diagnosis not present

## 2018-02-16 DIAGNOSIS — N400 Enlarged prostate without lower urinary tract symptoms: Secondary | ICD-10-CM | POA: Diagnosis not present

## 2018-02-16 DIAGNOSIS — J309 Allergic rhinitis, unspecified: Secondary | ICD-10-CM | POA: Diagnosis not present

## 2018-02-16 DIAGNOSIS — H1045 Other chronic allergic conjunctivitis: Secondary | ICD-10-CM | POA: Diagnosis not present

## 2018-02-16 DIAGNOSIS — E785 Hyperlipidemia, unspecified: Secondary | ICD-10-CM | POA: Diagnosis not present

## 2018-02-16 DIAGNOSIS — K581 Irritable bowel syndrome with constipation: Secondary | ICD-10-CM | POA: Diagnosis not present

## 2018-02-16 DIAGNOSIS — M48062 Spinal stenosis, lumbar region with neurogenic claudication: Secondary | ICD-10-CM | POA: Diagnosis not present

## 2018-02-23 ENCOUNTER — Ambulatory Visit (INDEPENDENT_AMBULATORY_CARE_PROVIDER_SITE_OTHER): Payer: Medicare Other | Admitting: *Deleted

## 2018-02-23 DIAGNOSIS — G588 Other specified mononeuropathies: Secondary | ICD-10-CM | POA: Diagnosis not present

## 2018-02-23 DIAGNOSIS — J309 Allergic rhinitis, unspecified: Secondary | ICD-10-CM | POA: Diagnosis not present

## 2018-02-23 DIAGNOSIS — M533 Sacrococcygeal disorders, not elsewhere classified: Secondary | ICD-10-CM | POA: Diagnosis not present

## 2018-02-23 DIAGNOSIS — M11251 Other chondrocalcinosis, right hip: Secondary | ICD-10-CM | POA: Diagnosis not present

## 2018-02-23 DIAGNOSIS — M25552 Pain in left hip: Secondary | ICD-10-CM | POA: Diagnosis not present

## 2018-02-23 DIAGNOSIS — M160 Bilateral primary osteoarthritis of hip: Secondary | ICD-10-CM | POA: Diagnosis not present

## 2018-02-23 DIAGNOSIS — M8580 Other specified disorders of bone density and structure, unspecified site: Secondary | ICD-10-CM | POA: Diagnosis not present

## 2018-02-23 DIAGNOSIS — M4726 Other spondylosis with radiculopathy, lumbar region: Secondary | ICD-10-CM | POA: Diagnosis not present

## 2018-02-23 DIAGNOSIS — M11252 Other chondrocalcinosis, left hip: Secondary | ICD-10-CM | POA: Diagnosis not present

## 2018-02-23 DIAGNOSIS — M25551 Pain in right hip: Secondary | ICD-10-CM | POA: Diagnosis not present

## 2018-02-23 DIAGNOSIS — M5124 Other intervertebral disc displacement, thoracic region: Secondary | ICD-10-CM | POA: Diagnosis not present

## 2018-02-23 DIAGNOSIS — M47898 Other spondylosis, sacral and sacrococcygeal region: Secondary | ICD-10-CM | POA: Diagnosis not present

## 2018-03-02 ENCOUNTER — Ambulatory Visit (INDEPENDENT_AMBULATORY_CARE_PROVIDER_SITE_OTHER): Payer: Medicare Other | Admitting: *Deleted

## 2018-03-02 DIAGNOSIS — J309 Allergic rhinitis, unspecified: Secondary | ICD-10-CM

## 2018-03-09 ENCOUNTER — Ambulatory Visit (INDEPENDENT_AMBULATORY_CARE_PROVIDER_SITE_OTHER): Payer: Medicare Other | Admitting: *Deleted

## 2018-03-09 DIAGNOSIS — J309 Allergic rhinitis, unspecified: Secondary | ICD-10-CM

## 2018-03-09 DIAGNOSIS — K219 Gastro-esophageal reflux disease without esophagitis: Secondary | ICD-10-CM | POA: Diagnosis not present

## 2018-03-23 ENCOUNTER — Ambulatory Visit (INDEPENDENT_AMBULATORY_CARE_PROVIDER_SITE_OTHER): Payer: Medicare Other | Admitting: *Deleted

## 2018-03-23 DIAGNOSIS — H401122 Primary open-angle glaucoma, left eye, moderate stage: Secondary | ICD-10-CM | POA: Diagnosis not present

## 2018-03-23 DIAGNOSIS — J309 Allergic rhinitis, unspecified: Secondary | ICD-10-CM

## 2018-03-31 NOTE — Progress Notes (Signed)
VIALS EXP 04-02-19

## 2018-04-01 DIAGNOSIS — J3089 Other allergic rhinitis: Secondary | ICD-10-CM | POA: Diagnosis not present

## 2018-04-02 DIAGNOSIS — J3089 Other allergic rhinitis: Secondary | ICD-10-CM | POA: Diagnosis not present

## 2018-04-06 ENCOUNTER — Ambulatory Visit (INDEPENDENT_AMBULATORY_CARE_PROVIDER_SITE_OTHER): Payer: Medicare Other | Admitting: *Deleted

## 2018-04-06 DIAGNOSIS — J309 Allergic rhinitis, unspecified: Secondary | ICD-10-CM

## 2018-04-23 ENCOUNTER — Ambulatory Visit (INDEPENDENT_AMBULATORY_CARE_PROVIDER_SITE_OTHER): Payer: Medicare Other | Admitting: *Deleted

## 2018-04-23 DIAGNOSIS — J309 Allergic rhinitis, unspecified: Secondary | ICD-10-CM

## 2018-05-06 DIAGNOSIS — G588 Other specified mononeuropathies: Secondary | ICD-10-CM | POA: Diagnosis not present

## 2018-05-07 ENCOUNTER — Ambulatory Visit (INDEPENDENT_AMBULATORY_CARE_PROVIDER_SITE_OTHER): Payer: Medicare Other | Admitting: *Deleted

## 2018-05-07 DIAGNOSIS — J309 Allergic rhinitis, unspecified: Secondary | ICD-10-CM | POA: Diagnosis not present

## 2018-05-21 ENCOUNTER — Ambulatory Visit (INDEPENDENT_AMBULATORY_CARE_PROVIDER_SITE_OTHER): Payer: Medicare Other | Admitting: *Deleted

## 2018-05-21 DIAGNOSIS — J309 Allergic rhinitis, unspecified: Secondary | ICD-10-CM

## 2018-06-08 ENCOUNTER — Ambulatory Visit (INDEPENDENT_AMBULATORY_CARE_PROVIDER_SITE_OTHER): Payer: Medicare Other | Admitting: *Deleted

## 2018-06-08 DIAGNOSIS — J309 Allergic rhinitis, unspecified: Secondary | ICD-10-CM

## 2018-06-11 DIAGNOSIS — G588 Other specified mononeuropathies: Secondary | ICD-10-CM | POA: Diagnosis not present

## 2018-06-15 ENCOUNTER — Ambulatory Visit (INDEPENDENT_AMBULATORY_CARE_PROVIDER_SITE_OTHER): Payer: Medicare Other | Admitting: *Deleted

## 2018-06-15 DIAGNOSIS — J309 Allergic rhinitis, unspecified: Secondary | ICD-10-CM | POA: Diagnosis not present

## 2018-06-22 ENCOUNTER — Ambulatory Visit (INDEPENDENT_AMBULATORY_CARE_PROVIDER_SITE_OTHER): Payer: Medicare Other

## 2018-06-22 DIAGNOSIS — J309 Allergic rhinitis, unspecified: Secondary | ICD-10-CM

## 2018-06-22 DIAGNOSIS — H401122 Primary open-angle glaucoma, left eye, moderate stage: Secondary | ICD-10-CM | POA: Diagnosis not present

## 2018-06-29 ENCOUNTER — Ambulatory Visit (INDEPENDENT_AMBULATORY_CARE_PROVIDER_SITE_OTHER): Payer: Medicare Other | Admitting: *Deleted

## 2018-06-29 DIAGNOSIS — J309 Allergic rhinitis, unspecified: Secondary | ICD-10-CM

## 2018-07-06 ENCOUNTER — Ambulatory Visit (INDEPENDENT_AMBULATORY_CARE_PROVIDER_SITE_OTHER): Payer: Medicare Other | Admitting: *Deleted

## 2018-07-06 DIAGNOSIS — J309 Allergic rhinitis, unspecified: Secondary | ICD-10-CM

## 2018-07-13 DIAGNOSIS — Z6823 Body mass index (BMI) 23.0-23.9, adult: Secondary | ICD-10-CM | POA: Diagnosis not present

## 2018-07-13 DIAGNOSIS — R531 Weakness: Secondary | ICD-10-CM | POA: Diagnosis not present

## 2018-07-13 DIAGNOSIS — D509 Iron deficiency anemia, unspecified: Secondary | ICD-10-CM | POA: Diagnosis not present

## 2018-07-13 DIAGNOSIS — E538 Deficiency of other specified B group vitamins: Secondary | ICD-10-CM | POA: Diagnosis not present

## 2018-07-17 DIAGNOSIS — G588 Other specified mononeuropathies: Secondary | ICD-10-CM | POA: Diagnosis not present

## 2018-07-20 ENCOUNTER — Ambulatory Visit (INDEPENDENT_AMBULATORY_CARE_PROVIDER_SITE_OTHER): Payer: Medicare Other | Admitting: *Deleted

## 2018-07-20 DIAGNOSIS — J309 Allergic rhinitis, unspecified: Secondary | ICD-10-CM

## 2018-07-24 DIAGNOSIS — M5030 Other cervical disc degeneration, unspecified cervical region: Secondary | ICD-10-CM | POA: Diagnosis not present

## 2018-07-24 DIAGNOSIS — J302 Other seasonal allergic rhinitis: Secondary | ICD-10-CM | POA: Diagnosis not present

## 2018-07-24 DIAGNOSIS — Z6823 Body mass index (BMI) 23.0-23.9, adult: Secondary | ICD-10-CM | POA: Diagnosis not present

## 2018-07-24 DIAGNOSIS — K581 Irritable bowel syndrome with constipation: Secondary | ICD-10-CM | POA: Diagnosis not present

## 2018-07-25 ENCOUNTER — Other Ambulatory Visit: Payer: Self-pay | Admitting: Allergy and Immunology

## 2018-07-29 DIAGNOSIS — G588 Other specified mononeuropathies: Secondary | ICD-10-CM | POA: Diagnosis not present

## 2018-07-29 DIAGNOSIS — K219 Gastro-esophageal reflux disease without esophagitis: Secondary | ICD-10-CM | POA: Diagnosis not present

## 2018-08-06 ENCOUNTER — Ambulatory Visit (INDEPENDENT_AMBULATORY_CARE_PROVIDER_SITE_OTHER): Payer: Medicare Other | Admitting: *Deleted

## 2018-08-06 DIAGNOSIS — J309 Allergic rhinitis, unspecified: Secondary | ICD-10-CM

## 2018-08-10 DIAGNOSIS — M50321 Other cervical disc degeneration at C4-C5 level: Secondary | ICD-10-CM | POA: Diagnosis not present

## 2018-08-10 DIAGNOSIS — M9901 Segmental and somatic dysfunction of cervical region: Secondary | ICD-10-CM | POA: Diagnosis not present

## 2018-08-10 DIAGNOSIS — M9902 Segmental and somatic dysfunction of thoracic region: Secondary | ICD-10-CM | POA: Diagnosis not present

## 2018-08-10 DIAGNOSIS — M5382 Other specified dorsopathies, cervical region: Secondary | ICD-10-CM | POA: Diagnosis not present

## 2018-08-10 DIAGNOSIS — R293 Abnormal posture: Secondary | ICD-10-CM | POA: Diagnosis not present

## 2018-08-11 DIAGNOSIS — R293 Abnormal posture: Secondary | ICD-10-CM | POA: Diagnosis not present

## 2018-08-11 DIAGNOSIS — M9902 Segmental and somatic dysfunction of thoracic region: Secondary | ICD-10-CM | POA: Diagnosis not present

## 2018-08-11 DIAGNOSIS — M50321 Other cervical disc degeneration at C4-C5 level: Secondary | ICD-10-CM | POA: Diagnosis not present

## 2018-08-11 DIAGNOSIS — M9901 Segmental and somatic dysfunction of cervical region: Secondary | ICD-10-CM | POA: Diagnosis not present

## 2018-08-11 DIAGNOSIS — M5382 Other specified dorsopathies, cervical region: Secondary | ICD-10-CM | POA: Diagnosis not present

## 2018-08-17 ENCOUNTER — Ambulatory Visit (INDEPENDENT_AMBULATORY_CARE_PROVIDER_SITE_OTHER): Payer: Medicare Other | Admitting: *Deleted

## 2018-08-17 DIAGNOSIS — J309 Allergic rhinitis, unspecified: Secondary | ICD-10-CM | POA: Diagnosis not present

## 2018-08-17 DIAGNOSIS — M48061 Spinal stenosis, lumbar region without neurogenic claudication: Secondary | ICD-10-CM | POA: Diagnosis not present

## 2018-08-17 DIAGNOSIS — M5412 Radiculopathy, cervical region: Secondary | ICD-10-CM | POA: Diagnosis not present

## 2018-08-18 NOTE — Progress Notes (Signed)
VIALS EXP 08-19-19 

## 2018-08-19 DIAGNOSIS — J3089 Other allergic rhinitis: Secondary | ICD-10-CM | POA: Diagnosis not present

## 2018-08-20 DIAGNOSIS — J301 Allergic rhinitis due to pollen: Secondary | ICD-10-CM | POA: Diagnosis not present

## 2018-08-27 DIAGNOSIS — K635 Polyp of colon: Secondary | ICD-10-CM | POA: Diagnosis not present

## 2018-08-27 DIAGNOSIS — Z79899 Other long term (current) drug therapy: Secondary | ICD-10-CM | POA: Diagnosis not present

## 2018-08-27 DIAGNOSIS — Z1211 Encounter for screening for malignant neoplasm of colon: Secondary | ICD-10-CM | POA: Diagnosis not present

## 2018-08-27 DIAGNOSIS — K5730 Diverticulosis of large intestine without perforation or abscess without bleeding: Secondary | ICD-10-CM | POA: Diagnosis not present

## 2018-08-27 DIAGNOSIS — J45909 Unspecified asthma, uncomplicated: Secondary | ICD-10-CM | POA: Diagnosis not present

## 2018-08-27 DIAGNOSIS — R1310 Dysphagia, unspecified: Secondary | ICD-10-CM | POA: Diagnosis not present

## 2018-08-27 DIAGNOSIS — D129 Benign neoplasm of anus and anal canal: Secondary | ICD-10-CM | POA: Diagnosis not present

## 2018-08-27 DIAGNOSIS — K644 Residual hemorrhoidal skin tags: Secondary | ICD-10-CM | POA: Diagnosis not present

## 2018-08-27 DIAGNOSIS — K589 Irritable bowel syndrome without diarrhea: Secondary | ICD-10-CM | POA: Diagnosis not present

## 2018-08-27 DIAGNOSIS — K222 Esophageal obstruction: Secondary | ICD-10-CM | POA: Diagnosis not present

## 2018-08-27 DIAGNOSIS — K621 Rectal polyp: Secondary | ICD-10-CM | POA: Diagnosis not present

## 2018-08-27 DIAGNOSIS — K219 Gastro-esophageal reflux disease without esophagitis: Secondary | ICD-10-CM | POA: Diagnosis not present

## 2018-08-27 DIAGNOSIS — M1990 Unspecified osteoarthritis, unspecified site: Secondary | ICD-10-CM | POA: Diagnosis not present

## 2018-08-31 ENCOUNTER — Ambulatory Visit (INDEPENDENT_AMBULATORY_CARE_PROVIDER_SITE_OTHER): Payer: Medicare Other | Admitting: *Deleted

## 2018-08-31 DIAGNOSIS — J309 Allergic rhinitis, unspecified: Secondary | ICD-10-CM

## 2018-09-03 DIAGNOSIS — M5136 Other intervertebral disc degeneration, lumbar region: Secondary | ICD-10-CM | POA: Diagnosis not present

## 2018-09-03 DIAGNOSIS — M5030 Other cervical disc degeneration, unspecified cervical region: Secondary | ICD-10-CM | POA: Diagnosis not present

## 2018-09-14 ENCOUNTER — Ambulatory Visit (INDEPENDENT_AMBULATORY_CARE_PROVIDER_SITE_OTHER): Payer: Medicare Other | Admitting: *Deleted

## 2018-09-14 DIAGNOSIS — J309 Allergic rhinitis, unspecified: Secondary | ICD-10-CM | POA: Diagnosis not present

## 2018-09-16 DIAGNOSIS — Z23 Encounter for immunization: Secondary | ICD-10-CM | POA: Diagnosis not present

## 2018-09-17 DIAGNOSIS — M961 Postlaminectomy syndrome, not elsewhere classified: Secondary | ICD-10-CM | POA: Diagnosis not present

## 2018-09-17 DIAGNOSIS — M5136 Other intervertebral disc degeneration, lumbar region: Secondary | ICD-10-CM | POA: Diagnosis not present

## 2018-09-17 DIAGNOSIS — M5030 Other cervical disc degeneration, unspecified cervical region: Secondary | ICD-10-CM | POA: Diagnosis not present

## 2018-09-24 DIAGNOSIS — M5030 Other cervical disc degeneration, unspecified cervical region: Secondary | ICD-10-CM | POA: Diagnosis not present

## 2018-09-24 DIAGNOSIS — M542 Cervicalgia: Secondary | ICD-10-CM | POA: Diagnosis not present

## 2018-09-28 ENCOUNTER — Ambulatory Visit (INDEPENDENT_AMBULATORY_CARE_PROVIDER_SITE_OTHER): Payer: Medicare Other | Admitting: *Deleted

## 2018-09-28 DIAGNOSIS — J309 Allergic rhinitis, unspecified: Secondary | ICD-10-CM | POA: Diagnosis not present

## 2018-09-28 DIAGNOSIS — M5030 Other cervical disc degeneration, unspecified cervical region: Secondary | ICD-10-CM | POA: Diagnosis not present

## 2018-09-28 DIAGNOSIS — M542 Cervicalgia: Secondary | ICD-10-CM | POA: Diagnosis not present

## 2018-10-01 DIAGNOSIS — M5030 Other cervical disc degeneration, unspecified cervical region: Secondary | ICD-10-CM | POA: Diagnosis not present

## 2018-10-01 DIAGNOSIS — M542 Cervicalgia: Secondary | ICD-10-CM | POA: Diagnosis not present

## 2018-10-05 DIAGNOSIS — M542 Cervicalgia: Secondary | ICD-10-CM | POA: Diagnosis not present

## 2018-10-05 DIAGNOSIS — M5030 Other cervical disc degeneration, unspecified cervical region: Secondary | ICD-10-CM | POA: Diagnosis not present

## 2018-10-12 ENCOUNTER — Ambulatory Visit (INDEPENDENT_AMBULATORY_CARE_PROVIDER_SITE_OTHER): Payer: Medicare Other | Admitting: *Deleted

## 2018-10-12 DIAGNOSIS — M5030 Other cervical disc degeneration, unspecified cervical region: Secondary | ICD-10-CM | POA: Diagnosis not present

## 2018-10-12 DIAGNOSIS — J309 Allergic rhinitis, unspecified: Secondary | ICD-10-CM

## 2018-10-12 DIAGNOSIS — M542 Cervicalgia: Secondary | ICD-10-CM | POA: Diagnosis not present

## 2018-10-26 ENCOUNTER — Ambulatory Visit (INDEPENDENT_AMBULATORY_CARE_PROVIDER_SITE_OTHER): Payer: Medicare Other | Admitting: *Deleted

## 2018-10-26 DIAGNOSIS — J309 Allergic rhinitis, unspecified: Secondary | ICD-10-CM

## 2018-10-28 DIAGNOSIS — H401122 Primary open-angle glaucoma, left eye, moderate stage: Secondary | ICD-10-CM | POA: Diagnosis not present

## 2018-11-11 ENCOUNTER — Ambulatory Visit (INDEPENDENT_AMBULATORY_CARE_PROVIDER_SITE_OTHER): Payer: Medicare Other | Admitting: *Deleted

## 2018-11-11 DIAGNOSIS — J309 Allergic rhinitis, unspecified: Secondary | ICD-10-CM

## 2018-11-23 ENCOUNTER — Ambulatory Visit (INDEPENDENT_AMBULATORY_CARE_PROVIDER_SITE_OTHER): Payer: Medicare Other

## 2018-11-23 DIAGNOSIS — J309 Allergic rhinitis, unspecified: Secondary | ICD-10-CM | POA: Diagnosis not present

## 2018-12-01 DIAGNOSIS — G629 Polyneuropathy, unspecified: Secondary | ICD-10-CM | POA: Diagnosis not present

## 2018-12-01 DIAGNOSIS — J019 Acute sinusitis, unspecified: Secondary | ICD-10-CM | POA: Diagnosis not present

## 2018-12-07 ENCOUNTER — Ambulatory Visit (INDEPENDENT_AMBULATORY_CARE_PROVIDER_SITE_OTHER): Payer: Medicare Other | Admitting: *Deleted

## 2018-12-07 DIAGNOSIS — J309 Allergic rhinitis, unspecified: Secondary | ICD-10-CM

## 2018-12-28 ENCOUNTER — Ambulatory Visit (INDEPENDENT_AMBULATORY_CARE_PROVIDER_SITE_OTHER): Payer: Medicare Other

## 2018-12-28 DIAGNOSIS — J309 Allergic rhinitis, unspecified: Secondary | ICD-10-CM

## 2018-12-29 DIAGNOSIS — J3089 Other allergic rhinitis: Secondary | ICD-10-CM

## 2018-12-29 NOTE — Progress Notes (Signed)
Exp 12/30/19

## 2018-12-30 DIAGNOSIS — J301 Allergic rhinitis due to pollen: Secondary | ICD-10-CM

## 2019-01-07 ENCOUNTER — Encounter: Payer: Self-pay | Admitting: Gastroenterology

## 2019-01-18 ENCOUNTER — Ambulatory Visit (INDEPENDENT_AMBULATORY_CARE_PROVIDER_SITE_OTHER): Payer: Medicare Other | Admitting: *Deleted

## 2019-01-18 DIAGNOSIS — J309 Allergic rhinitis, unspecified: Secondary | ICD-10-CM

## 2019-01-20 DIAGNOSIS — H6121 Impacted cerumen, right ear: Secondary | ICD-10-CM | POA: Diagnosis not present

## 2019-01-20 DIAGNOSIS — H9201 Otalgia, right ear: Secondary | ICD-10-CM | POA: Diagnosis not present

## 2019-01-26 DIAGNOSIS — J019 Acute sinusitis, unspecified: Secondary | ICD-10-CM | POA: Diagnosis not present

## 2019-01-26 DIAGNOSIS — K219 Gastro-esophageal reflux disease without esophagitis: Secondary | ICD-10-CM | POA: Diagnosis not present

## 2019-02-08 ENCOUNTER — Ambulatory Visit (INDEPENDENT_AMBULATORY_CARE_PROVIDER_SITE_OTHER): Payer: Medicare Other | Admitting: *Deleted

## 2019-02-08 DIAGNOSIS — J309 Allergic rhinitis, unspecified: Secondary | ICD-10-CM

## 2019-02-10 DIAGNOSIS — H401122 Primary open-angle glaucoma, left eye, moderate stage: Secondary | ICD-10-CM | POA: Diagnosis not present

## 2019-02-10 DIAGNOSIS — H25813 Combined forms of age-related cataract, bilateral: Secondary | ICD-10-CM | POA: Diagnosis not present

## 2019-02-18 DIAGNOSIS — M5136 Other intervertebral disc degeneration, lumbar region: Secondary | ICD-10-CM | POA: Diagnosis not present

## 2019-02-18 DIAGNOSIS — M5030 Other cervical disc degeneration, unspecified cervical region: Secondary | ICD-10-CM | POA: Diagnosis not present

## 2019-03-01 ENCOUNTER — Ambulatory Visit (INDEPENDENT_AMBULATORY_CARE_PROVIDER_SITE_OTHER): Payer: Medicare Other | Admitting: *Deleted

## 2019-03-01 DIAGNOSIS — J309 Allergic rhinitis, unspecified: Secondary | ICD-10-CM | POA: Diagnosis not present

## 2019-03-22 ENCOUNTER — Ambulatory Visit (INDEPENDENT_AMBULATORY_CARE_PROVIDER_SITE_OTHER): Payer: Medicare Other | Admitting: *Deleted

## 2019-03-22 DIAGNOSIS — J309 Allergic rhinitis, unspecified: Secondary | ICD-10-CM | POA: Diagnosis not present

## 2019-04-05 ENCOUNTER — Ambulatory Visit (INDEPENDENT_AMBULATORY_CARE_PROVIDER_SITE_OTHER): Payer: Medicare Other | Admitting: *Deleted

## 2019-04-05 DIAGNOSIS — J309 Allergic rhinitis, unspecified: Secondary | ICD-10-CM | POA: Diagnosis not present

## 2019-04-08 ENCOUNTER — Encounter: Payer: Self-pay | Admitting: Gastroenterology

## 2019-04-12 DIAGNOSIS — Z9181 History of falling: Secondary | ICD-10-CM | POA: Diagnosis not present

## 2019-04-12 DIAGNOSIS — Z139 Encounter for screening, unspecified: Secondary | ICD-10-CM | POA: Diagnosis not present

## 2019-04-12 DIAGNOSIS — Z0000 Encounter for general adult medical examination without abnormal findings: Secondary | ICD-10-CM | POA: Diagnosis not present

## 2019-04-12 DIAGNOSIS — E785 Hyperlipidemia, unspecified: Secondary | ICD-10-CM | POA: Diagnosis not present

## 2019-04-19 ENCOUNTER — Ambulatory Visit (INDEPENDENT_AMBULATORY_CARE_PROVIDER_SITE_OTHER): Payer: Medicare Other | Admitting: *Deleted

## 2019-04-19 DIAGNOSIS — J309 Allergic rhinitis, unspecified: Secondary | ICD-10-CM

## 2019-05-03 ENCOUNTER — Ambulatory Visit (INDEPENDENT_AMBULATORY_CARE_PROVIDER_SITE_OTHER): Payer: Medicare Other | Admitting: *Deleted

## 2019-05-03 DIAGNOSIS — J309 Allergic rhinitis, unspecified: Secondary | ICD-10-CM | POA: Diagnosis not present

## 2019-05-13 DIAGNOSIS — G588 Other specified mononeuropathies: Secondary | ICD-10-CM | POA: Diagnosis not present

## 2019-05-17 ENCOUNTER — Ambulatory Visit (INDEPENDENT_AMBULATORY_CARE_PROVIDER_SITE_OTHER): Payer: Medicare Other

## 2019-05-17 DIAGNOSIS — J309 Allergic rhinitis, unspecified: Secondary | ICD-10-CM

## 2019-05-27 DIAGNOSIS — G588 Other specified mononeuropathies: Secondary | ICD-10-CM | POA: Diagnosis not present

## 2019-06-14 ENCOUNTER — Ambulatory Visit (INDEPENDENT_AMBULATORY_CARE_PROVIDER_SITE_OTHER): Payer: Medicare Other | Admitting: *Deleted

## 2019-06-14 DIAGNOSIS — J309 Allergic rhinitis, unspecified: Secondary | ICD-10-CM

## 2019-07-05 ENCOUNTER — Ambulatory Visit (INDEPENDENT_AMBULATORY_CARE_PROVIDER_SITE_OTHER): Payer: Medicare Other | Admitting: *Deleted

## 2019-07-05 DIAGNOSIS — J309 Allergic rhinitis, unspecified: Secondary | ICD-10-CM | POA: Diagnosis not present

## 2019-07-13 NOTE — Progress Notes (Signed)
VIALS EXP 07-12-2020 

## 2019-07-14 DIAGNOSIS — J3089 Other allergic rhinitis: Secondary | ICD-10-CM | POA: Diagnosis not present

## 2019-07-15 DIAGNOSIS — J301 Allergic rhinitis due to pollen: Secondary | ICD-10-CM | POA: Diagnosis not present

## 2019-07-19 DIAGNOSIS — J321 Chronic frontal sinusitis: Secondary | ICD-10-CM | POA: Diagnosis not present

## 2019-07-19 DIAGNOSIS — K219 Gastro-esophageal reflux disease without esophagitis: Secondary | ICD-10-CM | POA: Diagnosis not present

## 2019-07-19 DIAGNOSIS — M5137 Other intervertebral disc degeneration, lumbosacral region: Secondary | ICD-10-CM | POA: Diagnosis not present

## 2019-07-19 DIAGNOSIS — E785 Hyperlipidemia, unspecified: Secondary | ICD-10-CM | POA: Diagnosis not present

## 2019-07-26 ENCOUNTER — Ambulatory Visit (INDEPENDENT_AMBULATORY_CARE_PROVIDER_SITE_OTHER): Payer: Medicare Other | Admitting: *Deleted

## 2019-07-26 DIAGNOSIS — J309 Allergic rhinitis, unspecified: Secondary | ICD-10-CM

## 2019-08-16 ENCOUNTER — Ambulatory Visit (INDEPENDENT_AMBULATORY_CARE_PROVIDER_SITE_OTHER): Payer: Medicare Other | Admitting: *Deleted

## 2019-08-16 DIAGNOSIS — J309 Allergic rhinitis, unspecified: Secondary | ICD-10-CM

## 2019-08-18 DIAGNOSIS — H401122 Primary open-angle glaucoma, left eye, moderate stage: Secondary | ICD-10-CM | POA: Diagnosis not present

## 2019-08-18 DIAGNOSIS — H04123 Dry eye syndrome of bilateral lacrimal glands: Secondary | ICD-10-CM | POA: Diagnosis not present

## 2019-08-18 DIAGNOSIS — H5789 Other specified disorders of eye and adnexa: Secondary | ICD-10-CM | POA: Diagnosis not present

## 2019-08-23 DIAGNOSIS — J069 Acute upper respiratory infection, unspecified: Secondary | ICD-10-CM | POA: Diagnosis not present

## 2019-08-23 DIAGNOSIS — R0789 Other chest pain: Secondary | ICD-10-CM | POA: Diagnosis not present

## 2019-08-25 DIAGNOSIS — H04123 Dry eye syndrome of bilateral lacrimal glands: Secondary | ICD-10-CM | POA: Diagnosis not present

## 2019-09-11 DIAGNOSIS — Z23 Encounter for immunization: Secondary | ICD-10-CM | POA: Diagnosis not present

## 2019-09-13 ENCOUNTER — Ambulatory Visit (INDEPENDENT_AMBULATORY_CARE_PROVIDER_SITE_OTHER): Payer: Medicare Other | Admitting: *Deleted

## 2019-09-13 DIAGNOSIS — J309 Allergic rhinitis, unspecified: Secondary | ICD-10-CM | POA: Diagnosis not present

## 2019-09-30 DIAGNOSIS — M25571 Pain in right ankle and joints of right foot: Secondary | ICD-10-CM | POA: Diagnosis not present

## 2019-10-01 DIAGNOSIS — M19071 Primary osteoarthritis, right ankle and foot: Secondary | ICD-10-CM | POA: Diagnosis not present

## 2019-10-01 DIAGNOSIS — M79671 Pain in right foot: Secondary | ICD-10-CM | POA: Diagnosis not present

## 2019-10-01 DIAGNOSIS — M25571 Pain in right ankle and joints of right foot: Secondary | ICD-10-CM | POA: Diagnosis not present

## 2019-10-04 ENCOUNTER — Ambulatory Visit (INDEPENDENT_AMBULATORY_CARE_PROVIDER_SITE_OTHER): Payer: Medicare Other | Admitting: *Deleted

## 2019-10-04 DIAGNOSIS — J309 Allergic rhinitis, unspecified: Secondary | ICD-10-CM

## 2019-10-07 ENCOUNTER — Other Ambulatory Visit: Payer: Self-pay

## 2019-10-07 ENCOUNTER — Ambulatory Visit: Payer: Medicare Other | Admitting: Sports Medicine

## 2019-10-07 ENCOUNTER — Other Ambulatory Visit: Payer: Self-pay | Admitting: Sports Medicine

## 2019-10-07 DIAGNOSIS — S93402A Sprain of unspecified ligament of left ankle, initial encounter: Secondary | ICD-10-CM | POA: Diagnosis not present

## 2019-10-07 DIAGNOSIS — S93401A Sprain of unspecified ligament of right ankle, initial encounter: Secondary | ICD-10-CM

## 2019-10-07 DIAGNOSIS — M25572 Pain in left ankle and joints of left foot: Secondary | ICD-10-CM | POA: Diagnosis not present

## 2019-10-07 DIAGNOSIS — M25571 Pain in right ankle and joints of right foot: Secondary | ICD-10-CM

## 2019-10-07 DIAGNOSIS — M7751 Other enthesopathy of right foot: Secondary | ICD-10-CM

## 2019-10-07 MED ORDER — TRIAMCINOLONE ACETONIDE 10 MG/ML IJ SUSP
10.0000 mg | Freq: Once | INTRAMUSCULAR | Status: AC
  Administered 2019-10-07: 12:00:00 10 mg

## 2019-10-07 NOTE — Progress Notes (Signed)
Subjective:  Almir D Mankins is a 78 y.o. male patient who presents to office for evaluation of right greater than left ankle pain. Patient complains of continued pain in the ankle since 2 to 3 months ago reports that he had an incident where he stepped in a hole and turned his ankle pain continues to be worse on unlevel surfaces however patient is still continuing to play golf. Patient has tried icing ibuprofen and gabapentin with no relief in symptoms.  Admits to a history of neuropathy and burning pain that is improved with use of gabapentin.  Patient reports that the pain radiates on the lateral side of foot and ankles at worst 7 out of 10.  Patient denies any other pedal complaints.  Review of Systems  All other systems reviewed and are negative.   Patient Active Problem List   Diagnosis Date Noted  . Paresthesia 12/31/2017  . Chronic low back pain 12/31/2017  . Polyneuropathy 12/09/2017  . Allergic rhinoconjunctivitis 10/19/2015  . 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 Headache 11/30/2014  . Dizziness 11/30/2014  . Abdominal pain, chronic, right lower quadrant 06/11/2013  . Male pelvic pain 03/24/2012  . FLATULENCE-GAS-BLOATING 01/30/2009  . ASTHMA 10/14/2008  . COPD 10/14/2008  . GERD 10/14/2008  . CHEST PAIN UNSPECIFIED 10/14/2008  . BENIGN NEOPLASM [place] DIGESTIVE SYSTEM 11/19/2005  . IRRITABLE BOWEL SYNDROME 10/20/2005    Current Outpatient Medications on File Prior to Visit  Medication Sig Dispense Refill  . diazepam (VALIUM) 5 MG tablet Take 5 mg by mouth every 12 (twelve) hours as needed for anxiety.    . dicyclomine (BENTYL) 10 MG capsule Take 10 mg by mouth 4 (four) times daily -  before meals and at bedtime.    . fexofenadine (ALLEGRA) 180 MG tablet Take 180 mg by mouth as needed.    . fish oil-omega-3 fatty acids 1000 MG capsule Take 2 g by mouth daily.    . fluticasone (FLONASE) 50 MCG/ACT nasal spray Place 1 spray into both nostrils daily.    Marland Kitchen gabapentin  (NEURONTIN) 300 MG capsule TAKE ONE CAPSULE BY MOUTH THREE TIMES DAILY 30 capsule 0  . HYDROcodone-acetaminophen (NORCO/VICODIN) 5-325 MG per tablet Take 1 tablet by mouth every 6 (six) hours as needed for pain.    Marland Kitchen ibuprofen (ADVIL,MOTRIN) 800 MG tablet Take 800 mg by mouth as needed.    Marland Kitchen ketotifen (ALAWAY) 0.025 % ophthalmic solution 1 drop 2 (two) times daily.    . montelukast (SINGULAIR) 10 MG tablet Take 10 mg by mouth at bedtime. Reported on 12/11/2015    . Multiple Vitamins-Minerals (CENTRUM SILVER PO) Take by mouth.    Marland Kitchen omeprazole (PRILOSEC) 40 MG capsule Take 1 capsule (40 mg total) by mouth daily. 90 capsule 3  . ranitidine (ZANTAC) 150 MG tablet Take 150 mg by mouth 2 (two) times daily.     No current facility-administered medications on file prior to visit.     No Known Allergies  Objective:  General: Alert and oriented x3 in no acute distress  Dermatology: No open lesions bilateral lower extremities, no webspace macerations, no ecchymosis bilateral, all nails x 10 are well manicured.  Vascular: Dorsalis Pedis and Posterior Tibial pedal pulses palpable, Capillary Fill Time 5 seconds, scant pedal hair growth bilateral, no edema bilateral lower extremities, varicosities bilateral, temperature gradient within normal limits.  Neurology: Johney Maine sensation intact via light touch bilateral.  Musculoskeletal: Mild tenderness with palpation at lateral ankle gutter, sinus tarsi, and peroneal tendon course right greater  than left. Negative talar tilt, Negative tib-fib stress, No instability. No pain with calf compression bilateral. Range of motion within normal limits with mild guarding on right greater than left ankle. Strength within normal limits in all groups bilateral.    Assessment and Plan: Problem List Items Addressed This Visit    None    Visit Diagnoses    Capsulitis of ankle, right    -  Primary   Relevant Medications   triamcinolone acetonide (KENALOG) 10 MG/ML injection  10 mg (Completed) (Start on 10/07/2019 12:30 PM)   Bilateral ankle pain, unspecified chronicity       Sprain of right ankle, unspecified ligament, initial encounter       Sprain of left ankle, unspecified ligament, initial encounter           -Complete examination performed -Patient declined new set of x-rays computer was down and could not review them from [place] system however patient reports that previous x-ray showed areas of arthritis and an old injury but nothing new -Discussed treatement options for possible capsulitis versus residual effects of sprain with instability right greater than left -After oral consent and aseptic prep, injected a mixture containing 1 ml of 2%  plain lidocaine, 1 ml 0.5% plain marcaine, 0.5 ml of kenalog 10 and 0.5 ml of dexamethasone phosphate into right lateral ankle without complication. Post-injection care discussed with patient.  -Dispensed ankle gauntlet for patient to use as instructed bilateral -Advised patient to refrain from playing golf order to decrease to once a week until symptoms improve -Advised rest ice elevation and good supportive shoes daily -Patient to return to office in 3 to 4 weeks or sooner if condition worsens.  Advised patient that if pain continues may benefit from an MRI versus physical therapy.  Landis Martins, DPM

## 2019-10-14 DIAGNOSIS — M5030 Other cervical disc degeneration, unspecified cervical region: Secondary | ICD-10-CM | POA: Diagnosis not present

## 2019-10-14 DIAGNOSIS — M5136 Other intervertebral disc degeneration, lumbar region: Secondary | ICD-10-CM | POA: Diagnosis not present

## 2019-10-18 ENCOUNTER — Ambulatory Visit (INDEPENDENT_AMBULATORY_CARE_PROVIDER_SITE_OTHER): Payer: Medicare Other

## 2019-10-18 DIAGNOSIS — J309 Allergic rhinitis, unspecified: Secondary | ICD-10-CM

## 2019-11-04 ENCOUNTER — Ambulatory Visit: Payer: Medicare Other | Admitting: Sports Medicine

## 2019-11-08 ENCOUNTER — Ambulatory Visit (INDEPENDENT_AMBULATORY_CARE_PROVIDER_SITE_OTHER): Payer: Medicare Other | Admitting: *Deleted

## 2019-11-08 DIAGNOSIS — J309 Allergic rhinitis, unspecified: Secondary | ICD-10-CM | POA: Diagnosis not present

## 2019-12-06 ENCOUNTER — Ambulatory Visit (INDEPENDENT_AMBULATORY_CARE_PROVIDER_SITE_OTHER): Payer: Medicare Other | Admitting: *Deleted

## 2019-12-06 DIAGNOSIS — J309 Allergic rhinitis, unspecified: Secondary | ICD-10-CM | POA: Diagnosis not present

## 2019-12-16 DIAGNOSIS — B9689 Other specified bacterial agents as the cause of diseases classified elsewhere: Secondary | ICD-10-CM | POA: Diagnosis not present

## 2019-12-16 DIAGNOSIS — J019 Acute sinusitis, unspecified: Secondary | ICD-10-CM | POA: Diagnosis not present

## 2019-12-16 DIAGNOSIS — Z20822 Contact with and (suspected) exposure to covid-19: Secondary | ICD-10-CM | POA: Diagnosis not present

## 2019-12-16 DIAGNOSIS — R6889 Other general symptoms and signs: Secondary | ICD-10-CM | POA: Diagnosis not present

## 2020-01-03 ENCOUNTER — Ambulatory Visit (INDEPENDENT_AMBULATORY_CARE_PROVIDER_SITE_OTHER): Payer: Medicare Other | Admitting: *Deleted

## 2020-01-03 DIAGNOSIS — J309 Allergic rhinitis, unspecified: Secondary | ICD-10-CM | POA: Diagnosis not present

## 2020-01-17 ENCOUNTER — Ambulatory Visit (INDEPENDENT_AMBULATORY_CARE_PROVIDER_SITE_OTHER): Payer: Medicare Other | Admitting: *Deleted

## 2020-01-17 DIAGNOSIS — J309 Allergic rhinitis, unspecified: Secondary | ICD-10-CM

## 2020-01-31 ENCOUNTER — Ambulatory Visit (INDEPENDENT_AMBULATORY_CARE_PROVIDER_SITE_OTHER): Payer: Medicare Other | Admitting: *Deleted

## 2020-01-31 DIAGNOSIS — J309 Allergic rhinitis, unspecified: Secondary | ICD-10-CM | POA: Diagnosis not present

## 2020-02-14 ENCOUNTER — Ambulatory Visit (INDEPENDENT_AMBULATORY_CARE_PROVIDER_SITE_OTHER): Payer: Medicare Other | Admitting: *Deleted

## 2020-02-14 DIAGNOSIS — J309 Allergic rhinitis, unspecified: Secondary | ICD-10-CM | POA: Diagnosis not present

## 2020-02-15 DIAGNOSIS — J329 Chronic sinusitis, unspecified: Secondary | ICD-10-CM | POA: Diagnosis not present

## 2020-02-16 DIAGNOSIS — H10012 Acute follicular conjunctivitis, left eye: Secondary | ICD-10-CM | POA: Diagnosis not present

## 2020-02-16 DIAGNOSIS — H401122 Primary open-angle glaucoma, left eye, moderate stage: Secondary | ICD-10-CM | POA: Diagnosis not present

## 2020-02-17 DIAGNOSIS — M47816 Spondylosis without myelopathy or radiculopathy, lumbar region: Secondary | ICD-10-CM | POA: Diagnosis not present

## 2020-02-17 DIAGNOSIS — M961 Postlaminectomy syndrome, not elsewhere classified: Secondary | ICD-10-CM | POA: Diagnosis not present

## 2020-02-28 ENCOUNTER — Ambulatory Visit (INDEPENDENT_AMBULATORY_CARE_PROVIDER_SITE_OTHER): Payer: Medicare Other | Admitting: *Deleted

## 2020-02-28 DIAGNOSIS — J309 Allergic rhinitis, unspecified: Secondary | ICD-10-CM

## 2020-03-02 DIAGNOSIS — M47816 Spondylosis without myelopathy or radiculopathy, lumbar region: Secondary | ICD-10-CM | POA: Diagnosis not present

## 2020-03-09 DIAGNOSIS — H5789 Other specified disorders of eye and adnexa: Secondary | ICD-10-CM | POA: Diagnosis not present

## 2020-03-09 DIAGNOSIS — H401122 Primary open-angle glaucoma, left eye, moderate stage: Secondary | ICD-10-CM | POA: Diagnosis not present

## 2020-03-27 ENCOUNTER — Ambulatory Visit (INDEPENDENT_AMBULATORY_CARE_PROVIDER_SITE_OTHER): Payer: Medicare Other | Admitting: *Deleted

## 2020-03-27 DIAGNOSIS — J309 Allergic rhinitis, unspecified: Secondary | ICD-10-CM

## 2020-04-17 DIAGNOSIS — E785 Hyperlipidemia, unspecified: Secondary | ICD-10-CM | POA: Diagnosis not present

## 2020-04-17 DIAGNOSIS — Z139 Encounter for screening, unspecified: Secondary | ICD-10-CM | POA: Diagnosis not present

## 2020-04-17 DIAGNOSIS — Z0000 Encounter for general adult medical examination without abnormal findings: Secondary | ICD-10-CM | POA: Diagnosis not present

## 2020-04-17 DIAGNOSIS — Z9181 History of falling: Secondary | ICD-10-CM | POA: Diagnosis not present

## 2020-04-18 NOTE — Progress Notes (Signed)
Vials exp 04-18-21

## 2020-04-19 DIAGNOSIS — J3089 Other allergic rhinitis: Secondary | ICD-10-CM | POA: Diagnosis not present

## 2020-04-20 DIAGNOSIS — J301 Allergic rhinitis due to pollen: Secondary | ICD-10-CM | POA: Diagnosis not present

## 2020-04-24 ENCOUNTER — Ambulatory Visit (INDEPENDENT_AMBULATORY_CARE_PROVIDER_SITE_OTHER): Payer: Medicare Other | Admitting: *Deleted

## 2020-04-24 DIAGNOSIS — J309 Allergic rhinitis, unspecified: Secondary | ICD-10-CM

## 2020-05-12 DIAGNOSIS — J3089 Other allergic rhinitis: Secondary | ICD-10-CM | POA: Diagnosis not present

## 2020-05-12 DIAGNOSIS — H1010 Acute atopic conjunctivitis, unspecified eye: Secondary | ICD-10-CM | POA: Diagnosis not present

## 2020-05-12 DIAGNOSIS — J302 Other seasonal allergic rhinitis: Secondary | ICD-10-CM | POA: Diagnosis not present

## 2020-05-12 DIAGNOSIS — H10022 Other mucopurulent conjunctivitis, left eye: Secondary | ICD-10-CM | POA: Diagnosis not present

## 2020-05-29 ENCOUNTER — Ambulatory Visit (INDEPENDENT_AMBULATORY_CARE_PROVIDER_SITE_OTHER): Payer: Medicare Other

## 2020-05-29 DIAGNOSIS — J309 Allergic rhinitis, unspecified: Secondary | ICD-10-CM

## 2020-06-26 ENCOUNTER — Ambulatory Visit (INDEPENDENT_AMBULATORY_CARE_PROVIDER_SITE_OTHER): Payer: Medicare Other | Admitting: *Deleted

## 2020-06-26 DIAGNOSIS — J309 Allergic rhinitis, unspecified: Secondary | ICD-10-CM | POA: Diagnosis not present

## 2020-06-29 DIAGNOSIS — E860 Dehydration: Secondary | ICD-10-CM | POA: Diagnosis not present

## 2020-06-29 DIAGNOSIS — R42 Dizziness and giddiness: Secondary | ICD-10-CM | POA: Diagnosis not present

## 2020-07-24 ENCOUNTER — Ambulatory Visit (INDEPENDENT_AMBULATORY_CARE_PROVIDER_SITE_OTHER): Payer: Medicare Other | Admitting: *Deleted

## 2020-07-24 DIAGNOSIS — J309 Allergic rhinitis, unspecified: Secondary | ICD-10-CM

## 2020-08-03 DIAGNOSIS — M47816 Spondylosis without myelopathy or radiculopathy, lumbar region: Secondary | ICD-10-CM | POA: Diagnosis not present

## 2020-08-09 ENCOUNTER — Ambulatory Visit (INDEPENDENT_AMBULATORY_CARE_PROVIDER_SITE_OTHER): Payer: Medicare Other | Admitting: *Deleted

## 2020-08-09 DIAGNOSIS — J309 Allergic rhinitis, unspecified: Secondary | ICD-10-CM | POA: Diagnosis not present

## 2020-08-21 ENCOUNTER — Ambulatory Visit (INDEPENDENT_AMBULATORY_CARE_PROVIDER_SITE_OTHER): Payer: Medicare Other | Admitting: *Deleted

## 2020-08-21 DIAGNOSIS — J309 Allergic rhinitis, unspecified: Secondary | ICD-10-CM | POA: Diagnosis not present

## 2020-09-04 ENCOUNTER — Ambulatory Visit (INDEPENDENT_AMBULATORY_CARE_PROVIDER_SITE_OTHER): Payer: Medicare Other | Admitting: *Deleted

## 2020-09-04 DIAGNOSIS — J309 Allergic rhinitis, unspecified: Secondary | ICD-10-CM | POA: Diagnosis not present

## 2020-09-18 ENCOUNTER — Ambulatory Visit (INDEPENDENT_AMBULATORY_CARE_PROVIDER_SITE_OTHER): Payer: Medicare Other | Admitting: *Deleted

## 2020-09-18 DIAGNOSIS — J309 Allergic rhinitis, unspecified: Secondary | ICD-10-CM

## 2020-09-28 DIAGNOSIS — H401122 Primary open-angle glaucoma, left eye, moderate stage: Secondary | ICD-10-CM | POA: Diagnosis not present

## 2020-10-05 DIAGNOSIS — J208 Acute bronchitis due to other specified organisms: Secondary | ICD-10-CM | POA: Diagnosis not present

## 2020-10-05 DIAGNOSIS — J019 Acute sinusitis, unspecified: Secondary | ICD-10-CM | POA: Diagnosis not present

## 2020-10-19 ENCOUNTER — Ambulatory Visit (INDEPENDENT_AMBULATORY_CARE_PROVIDER_SITE_OTHER): Payer: Medicare Other

## 2020-10-19 DIAGNOSIS — J309 Allergic rhinitis, unspecified: Secondary | ICD-10-CM | POA: Diagnosis not present

## 2020-11-06 DIAGNOSIS — Z23 Encounter for immunization: Secondary | ICD-10-CM | POA: Diagnosis not present

## 2020-11-13 ENCOUNTER — Ambulatory Visit (INDEPENDENT_AMBULATORY_CARE_PROVIDER_SITE_OTHER): Payer: Medicare Other | Admitting: *Deleted

## 2020-11-13 DIAGNOSIS — H8113 Benign paroxysmal vertigo, bilateral: Secondary | ICD-10-CM | POA: Diagnosis not present

## 2020-11-13 DIAGNOSIS — J309 Allergic rhinitis, unspecified: Secondary | ICD-10-CM

## 2020-11-15 DIAGNOSIS — H8113 Benign paroxysmal vertigo, bilateral: Secondary | ICD-10-CM | POA: Diagnosis not present

## 2020-11-15 DIAGNOSIS — R519 Headache, unspecified: Secondary | ICD-10-CM | POA: Diagnosis not present

## 2020-11-15 DIAGNOSIS — K581 Irritable bowel syndrome with constipation: Secondary | ICD-10-CM | POA: Diagnosis not present

## 2020-11-27 ENCOUNTER — Telehealth: Payer: Self-pay | Admitting: *Deleted

## 2020-11-27 NOTE — Telephone Encounter (Signed)
Pt called wanting to make appt with Dr. Terrace Arabia. Last saw her in 2019. Wanting to come for evaluation of new sx. Advised he would need referral from PCP first. He verbalized understanding.

## 2020-12-11 ENCOUNTER — Ambulatory Visit (INDEPENDENT_AMBULATORY_CARE_PROVIDER_SITE_OTHER): Payer: Medicare Other | Admitting: *Deleted

## 2020-12-11 DIAGNOSIS — J309 Allergic rhinitis, unspecified: Secondary | ICD-10-CM

## 2020-12-12 DIAGNOSIS — J3089 Other allergic rhinitis: Secondary | ICD-10-CM | POA: Diagnosis not present

## 2020-12-12 NOTE — Progress Notes (Signed)
VIALS EXP 12-12-21 

## 2020-12-13 DIAGNOSIS — J301 Allergic rhinitis due to pollen: Secondary | ICD-10-CM | POA: Diagnosis not present

## 2020-12-19 ENCOUNTER — Encounter: Payer: Self-pay | Admitting: Neurology

## 2020-12-28 DIAGNOSIS — H8113 Benign paroxysmal vertigo, bilateral: Secondary | ICD-10-CM | POA: Diagnosis not present

## 2020-12-28 DIAGNOSIS — B9689 Other specified bacterial agents as the cause of diseases classified elsewhere: Secondary | ICD-10-CM | POA: Diagnosis not present

## 2020-12-28 DIAGNOSIS — J019 Acute sinusitis, unspecified: Secondary | ICD-10-CM | POA: Diagnosis not present

## 2021-01-08 ENCOUNTER — Ambulatory Visit (INDEPENDENT_AMBULATORY_CARE_PROVIDER_SITE_OTHER): Payer: Medicare Other

## 2021-01-08 DIAGNOSIS — J309 Allergic rhinitis, unspecified: Secondary | ICD-10-CM | POA: Diagnosis not present

## 2021-01-11 ENCOUNTER — Encounter: Payer: Self-pay | Admitting: Allergy and Immunology

## 2021-02-01 ENCOUNTER — Other Ambulatory Visit: Payer: Self-pay

## 2021-02-01 ENCOUNTER — Ambulatory Visit (INDEPENDENT_AMBULATORY_CARE_PROVIDER_SITE_OTHER): Payer: Medicare Other | Admitting: Allergy and Immunology

## 2021-02-01 ENCOUNTER — Encounter: Payer: Self-pay | Admitting: Allergy and Immunology

## 2021-02-01 VITALS — BP 110/64 | HR 63 | Resp 16 | Ht 69.0 in | Wt 174.8 lb

## 2021-02-01 DIAGNOSIS — J301 Allergic rhinitis due to pollen: Secondary | ICD-10-CM | POA: Diagnosis not present

## 2021-02-01 DIAGNOSIS — K219 Gastro-esophageal reflux disease without esophagitis: Secondary | ICD-10-CM | POA: Diagnosis not present

## 2021-02-01 DIAGNOSIS — J3089 Other allergic rhinitis: Secondary | ICD-10-CM | POA: Diagnosis not present

## 2021-02-01 DIAGNOSIS — H04123 Dry eye syndrome of bilateral lacrimal glands: Secondary | ICD-10-CM

## 2021-02-01 DIAGNOSIS — L989 Disorder of the skin and subcutaneous tissue, unspecified: Secondary | ICD-10-CM | POA: Diagnosis not present

## 2021-02-01 MED ORDER — AZELASTINE HCL 0.1 % NA SOLN: NASAL | 5 refills | Status: DC

## 2021-02-01 NOTE — Progress Notes (Signed)
[facility] [place] [facility] [place]   Follow-up Note  Referring Provider: Nicoletta Dress, MD Primary Provider: Nicoletta Dress, MD Date of Office Visit: 02/01/2021  Subjective:   Dennis Hoffman (DOB: 1941/03/01) is a 80 y.o. male who returns to the [facility] on 02/01/2021 in re-evaluation of the following:  HPI: Dennis Hoffman returns to this clinic in evaluation of allergic rhinoconjunctivitis, history of ocular rosacea, and LPR.  His last visit to this clinic was 14 November 2016.  He is using a course of immunotherapy without any adverse effect which has certainly helped his upper airway and eye issues and he has a history of asthma that has basically abated.  But he still intermittently has some sneezing and he has lots of postnasal drip and throat clearing.  It does not sound as though he has required a systemic steroid or an antibiotic for any type of airway issue in years. He is using just fexofenadine at nighttime at this point.  When he was last seen in this clinic he had a history very consistent with LPR and we recommended that he start aggressive therapy directed against this condition with a combination of omeprazole and an H2 receptor blocker and dramatically consolidated his caffeine.  He still has lots of postnasal drip and throat clearing and he has consolidated his caffeine but still has a tea every day and has a coffee every day.    He has dry eye syndrome and he has glaucoma.   He has also developed a rash on his left face over the course of the past several months.  There has been an issue with him developing vertigo and a peripheral neuropathy and he is scheduled to see a neurologist.  He has received 3 Moderna Covid vaccines and a flu vaccine.  Allergies as of 02/01/2021   No Known Allergies     Medication List    CENTRUM SILVER PO Take by mouth.   diazepam 5 MG tablet Commonly known as: VALIUM Take 5 mg by mouth  every 12 (twelve) hours as needed for anxiety.   dicyclomine 10 MG capsule Commonly known as: BENTYL Take 10 mg by mouth 4 (four) times daily -  before meals and at bedtime.   dorzolamide 2 % ophthalmic solution Commonly known as: TRUSOPT Place 1 drop into the left eye 2 (two) times daily.   famotidine 40 MG tablet Commonly known as: PEPCID Take 40 mg by mouth daily.   fexofenadine 180 MG tablet Commonly known as: ALLEGRA Take 180 mg by mouth as needed.   fish oil-omega-3 fatty acids 1000 MG capsule Take 2 g by mouth daily.   fluticasone 50 MCG/ACT nasal spray Commonly known as: FLONASE Place 1 spray into both nostrils daily.   gabapentin 600 MG tablet Commonly known as: NEURONTIN Take by mouth.   HYDROcodone-acetaminophen 7.5-325 MG tablet Commonly known as: NORCO Take by mouth.   ibuprofen 800 MG tablet Commonly known as: ADVIL Take 800 mg by mouth as needed.   montelukast 10 MG tablet Commonly known as: SINGULAIR Take 10 mg by mouth at bedtime. Reported on 12/11/2015       Past Medical History:  Diagnosis Date  . 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 COPD (chronic obstructive pulmonary disease) ([place])   . GERD (gastroesophageal reflux disease)   . Glaucoma   . Irritable bowel syndrome   . Polyneuropathy   .  Unspecified asthma(493.90)     Past Surgical History:  Procedure Laterality Date  . back sugery    . toe sugery      Review of systems negative except as noted in HPI / PMHx or noted below:  Review of Systems  Constitutional: Negative.   HENT: Negative.   Eyes: Negative.   Respiratory: Negative.   Cardiovascular: Negative.   Gastrointestinal: Negative.   Genitourinary: Negative.   Musculoskeletal: Negative.   Skin: Negative.   Neurological: Negative.   Endo/Heme/Allergies: Negative.   Psychiatric/Behavioral: Negative.      Objective:   Vitals:   02/01/21 0810  BP: 110/64  Pulse: 63  Resp: 16   SpO2: 98%   Height: 5\' 9"  (175.3 cm)  Weight: 174 lb 12.8 oz (79.3 kg)   Physical Exam Constitutional:      Appearance: He is not diaphoretic.  HENT:     Head: Normocephalic.     Right Ear: Tympanic membrane, ear canal and external ear normal.     Left Ear: Tympanic membrane, ear canal and external ear normal.     Nose: Nose normal. No mucosal edema or rhinorrhea.     Mouth/Throat:     Mouth: Oropharynx is clear and moist and mucous membranes are normal.     Pharynx: Uvula midline. No oropharyngeal exudate.  Eyes:     Conjunctiva/sclera: Conjunctivae normal.  Neck:     Thyroid: No thyromegaly.     Trachea: Trachea normal. No tracheal tenderness or tracheal deviation.  Cardiovascular:     Rate and Rhythm: Normal rate and regular rhythm.     Heart sounds: Normal heart sounds, S1 normal and S2 normal. No murmur heard.   Pulmonary:     Effort: No respiratory distress.     Breath sounds: Normal breath sounds. No stridor. No wheezing or rales.  Musculoskeletal:        General: No edema.  Lymphadenopathy:     Head:     Right side of head: No tonsillar adenopathy.     Left side of head: No tonsillar adenopathy.     Cervical: No cervical adenopathy.  Skin:    Findings: No erythema or rash (Left facial erythema without tenderness or induration).     Nails: There is no clubbing.  Neurological:     Mental Status: He is alert.     Diagnostics: none  Assessment and Plan:   1. Perennial allergic rhinitis   2. Seasonal allergic rhinitis due to pollen   3. LPRD (laryngopharyngeal reflux disease)   4. Inflammatory dermatosis   5. Dry eye syndrome of both eyes     1.  Treat and prevent inflammation:   A. Flonase or Nasacort - 2 sprays each nostril 1 time per day  B. Immunotherapy   2. Treat and prevent reflux / LPR:   A. Consolidate all caffeine consumption  B. Omeprazole 40 mg - 1 tablet in AM  B. Famotidine 40 mg - 1 tablet in PM  3. If needed:   A. Azelastine - 1  spray each nostril 2 times per day  B. Liquid tears  C. OTC hydrocortisone.  4. Do not use oral antihistamines secondary to dry eye syndrome  5. Return to clinic in 12 weeks or earlier if problem  6. May need to see Dermatology about facial rash  [name] will continue to utilize a course of immunotherapy directed against his atopic disease which certainly has helped this condition.  He still has some symptoms though involving  his nose and I recommended that he use a nasal steroid on a relatively consistent basis and he has the option of utilizing other agents as needed.  I did encourage him not to use oral antihistamines secondary to his dry eye syndrome.  He also has a history very consistent with LPR and I recommended that he use both a proton pump inhibitor in addition to his H2 receptor blocker.  He has some type of inflammatory dermatosis involving the left side of his face and he can try over-the-counter hydrocortisone and if this does not work then he probably needs to see dermatology for further investigation of this issue.  I will see him back in his clinic in 12 weeks to assess his response to this plan or earlier if there is a problem.  Allena Katz, MD Allergy / Immunology [place]

## 2021-02-01 NOTE — Patient Instructions (Addendum)
  1.  Treat and prevent inflammation:   A. Flonase or Nasacort - 2 sprays each nostril 1 time per day  B. Immunotherapy   2. Treat and prevent reflux / LPR:   A. Consolidate all caffeine consumption  B. Omeprazole 40 mg - 1 tablet in AM  B. Famotidine 40 mg - 1 tablet in PM  3. If needed:   A. Azelastine - 1 spray each nostril 2 times per day  B. Liquid tears  C. OTC hydrocortisone.  4. Do not use oral antihistamines secondary to dry eye syndrome  5. Return to clinic in 12 weeks or earlier if problem  6. May need to see Dermatology about facial rash

## 2021-02-02 ENCOUNTER — Encounter: Payer: Self-pay | Admitting: Allergy and Immunology

## 2021-02-05 ENCOUNTER — Ambulatory Visit (INDEPENDENT_AMBULATORY_CARE_PROVIDER_SITE_OTHER): Payer: Medicare Other | Admitting: *Deleted

## 2021-02-05 DIAGNOSIS — J309 Allergic rhinitis, unspecified: Secondary | ICD-10-CM | POA: Diagnosis not present

## 2021-02-28 ENCOUNTER — Ambulatory Visit: Payer: Medicare Other | Admitting: Neurology

## 2021-03-05 ENCOUNTER — Ambulatory Visit (INDEPENDENT_AMBULATORY_CARE_PROVIDER_SITE_OTHER): Payer: Medicare Other | Admitting: *Deleted

## 2021-03-05 DIAGNOSIS — J309 Allergic rhinitis, unspecified: Secondary | ICD-10-CM

## 2021-03-15 DIAGNOSIS — M47816 Spondylosis without myelopathy or radiculopathy, lumbar region: Secondary | ICD-10-CM | POA: Diagnosis not present

## 2021-03-19 ENCOUNTER — Ambulatory Visit (INDEPENDENT_AMBULATORY_CARE_PROVIDER_SITE_OTHER): Payer: Medicare Other | Admitting: *Deleted

## 2021-03-19 DIAGNOSIS — J309 Allergic rhinitis, unspecified: Secondary | ICD-10-CM | POA: Diagnosis not present

## 2021-03-20 DIAGNOSIS — G588 Other specified mononeuropathies: Secondary | ICD-10-CM | POA: Diagnosis not present

## 2021-03-29 DIAGNOSIS — H401122 Primary open-angle glaucoma, left eye, moderate stage: Secondary | ICD-10-CM | POA: Diagnosis not present

## 2021-04-02 ENCOUNTER — Ambulatory Visit (INDEPENDENT_AMBULATORY_CARE_PROVIDER_SITE_OTHER): Payer: Medicare Other | Admitting: *Deleted

## 2021-04-02 DIAGNOSIS — J309 Allergic rhinitis, unspecified: Secondary | ICD-10-CM | POA: Diagnosis not present

## 2021-04-02 DIAGNOSIS — L570 Actinic keratosis: Secondary | ICD-10-CM | POA: Diagnosis not present

## 2021-04-02 DIAGNOSIS — L578 Other skin changes due to chronic exposure to nonionizing radiation: Secondary | ICD-10-CM | POA: Diagnosis not present

## 2021-04-02 DIAGNOSIS — L219 Seborrheic dermatitis, unspecified: Secondary | ICD-10-CM | POA: Diagnosis not present

## 2021-04-02 DIAGNOSIS — L821 Other seborrheic keratosis: Secondary | ICD-10-CM | POA: Diagnosis not present

## 2021-04-04 DIAGNOSIS — D3616 Benign neoplasm of peripheral nerves and autonomic nervous system of pelvis: Secondary | ICD-10-CM | POA: Diagnosis not present

## 2021-04-04 DIAGNOSIS — G588 Other specified mononeuropathies: Secondary | ICD-10-CM | POA: Diagnosis not present

## 2021-04-16 ENCOUNTER — Ambulatory Visit (INDEPENDENT_AMBULATORY_CARE_PROVIDER_SITE_OTHER): Payer: Medicare Other | Admitting: *Deleted

## 2021-04-16 DIAGNOSIS — J309 Allergic rhinitis, unspecified: Secondary | ICD-10-CM | POA: Diagnosis not present

## 2021-04-19 DIAGNOSIS — Z0000 Encounter for general adult medical examination without abnormal findings: Secondary | ICD-10-CM | POA: Diagnosis not present

## 2021-04-19 DIAGNOSIS — E785 Hyperlipidemia, unspecified: Secondary | ICD-10-CM | POA: Diagnosis not present

## 2021-04-19 DIAGNOSIS — Z139 Encounter for screening, unspecified: Secondary | ICD-10-CM | POA: Diagnosis not present

## 2021-04-19 DIAGNOSIS — Z9181 History of falling: Secondary | ICD-10-CM | POA: Diagnosis not present

## 2021-04-24 DIAGNOSIS — G588 Other specified mononeuropathies: Secondary | ICD-10-CM | POA: Diagnosis not present

## 2021-04-26 DIAGNOSIS — J3089 Other allergic rhinitis: Secondary | ICD-10-CM | POA: Diagnosis not present

## 2021-04-26 DIAGNOSIS — M5137 Other intervertebral disc degeneration, lumbosacral region: Secondary | ICD-10-CM | POA: Diagnosis not present

## 2021-04-26 DIAGNOSIS — K449 Diaphragmatic hernia without obstruction or gangrene: Secondary | ICD-10-CM | POA: Diagnosis not present

## 2021-04-26 DIAGNOSIS — K581 Irritable bowel syndrome with constipation: Secondary | ICD-10-CM | POA: Diagnosis not present

## 2021-04-26 DIAGNOSIS — J302 Other seasonal allergic rhinitis: Secondary | ICD-10-CM | POA: Diagnosis not present

## 2021-04-26 DIAGNOSIS — E785 Hyperlipidemia, unspecified: Secondary | ICD-10-CM | POA: Diagnosis not present

## 2021-04-26 DIAGNOSIS — H1010 Acute atopic conjunctivitis, unspecified eye: Secondary | ICD-10-CM | POA: Diagnosis not present

## 2021-04-26 DIAGNOSIS — K219 Gastro-esophageal reflux disease without esophagitis: Secondary | ICD-10-CM | POA: Diagnosis not present

## 2021-04-26 DIAGNOSIS — M48062 Spinal stenosis, lumbar region with neurogenic claudication: Secondary | ICD-10-CM | POA: Diagnosis not present

## 2021-05-14 ENCOUNTER — Ambulatory Visit (INDEPENDENT_AMBULATORY_CARE_PROVIDER_SITE_OTHER): Payer: Medicare Other | Admitting: *Deleted

## 2021-05-14 DIAGNOSIS — J309 Allergic rhinitis, unspecified: Secondary | ICD-10-CM

## 2021-05-17 DIAGNOSIS — H6121 Impacted cerumen, right ear: Secondary | ICD-10-CM | POA: Diagnosis not present

## 2021-05-17 DIAGNOSIS — J3089 Other allergic rhinitis: Secondary | ICD-10-CM | POA: Diagnosis not present

## 2021-05-18 DIAGNOSIS — J329 Chronic sinusitis, unspecified: Secondary | ICD-10-CM | POA: Diagnosis not present

## 2021-06-11 ENCOUNTER — Ambulatory Visit (INDEPENDENT_AMBULATORY_CARE_PROVIDER_SITE_OTHER): Payer: Medicare Other | Admitting: *Deleted

## 2021-06-11 DIAGNOSIS — J309 Allergic rhinitis, unspecified: Secondary | ICD-10-CM | POA: Diagnosis not present

## 2021-06-13 NOTE — Progress Notes (Deleted)
NEUROLOGY CONSULTATION NOTE  DEO MEHRINGER MRN: [id number] DOB: Feb 13, 1941  Referring provider: Nelda Bucks, MD Primary care provider: Nelda Bucks, MD  Reason for consult:  dizziness  Assessment/Plan:   ***   Subjective:  Dennis Hoffman is a 80 year old ***-handed male with COPD, IBS, polyneuropathy and glaucoma who presents for dizziness.  History supplemented by referring provider's note.  ***.  Describes dizziness as a spinning sensation aggravated by movement.  He has associated nausea and vomiting.  He also notes neck pain as well.  He has tried meclizine and diazepam ***  ***.  MRI of brain without contrast on 12/14/2014 personally reviewed showed minimal nonspecific T2/FLAIR punctate foci in the periventricular and subcortical white matter but overall unremarkable.   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 30 mL 5   diazepam (VALIUM) 5 MG tablet Take 5 mg by mouth every 12 (twelve) hours as needed for anxiety.     dicyclomine (BENTYL) 10 MG capsule Take 10 mg by mouth 4 (four) times daily -  before meals and at bedtime.     dorzolamide (TRUSOPT) 2 % ophthalmic solution Place 1 drop into the left eye 2 (two) times daily.     famotidine (PEPCID) 40 MG tablet Take 40 mg by mouth daily.     fexofenadine (ALLEGRA) 180 MG tablet Take 180 mg by mouth as needed.     fish oil-omega-3  fatty acids 1000 MG capsule Take 2 g by mouth daily.     fluticasone (FLONASE) 50 MCG/ACT nasal spray Place 1 spray into both nostrils daily.     gabapentin (NEURONTIN) 600 MG tablet Take by mouth.     HYDROcodone-acetaminophen (NORCO) 7.5-325 MG tablet Take by mouth.     ibuprofen (ADVIL,MOTRIN) 800 MG tablet Take 800 mg by mouth as needed.     montelukast (SINGULAIR) 10 MG tablet Take 10 mg by mouth at bedtime. Reported on 12/11/2015 (Patient not taking: Reported on 02/01/2021)     Multiple Vitamins-Minerals (CENTRUM SILVER PO) Take by mouth.     No current facility-administered medications on file prior to visit.    ALLERGIES: No Known Allergies  FAMILY HISTORY: Family History  Problem Relation Age of Onset   Diabetes Mother    Other Father        "organs shut down"   Heart disease Other     Objective:  *** General: No acute distress.  Patient appears well-groomed.   Head:  Normocephalic/atraumatic Eyes:  fundi examined but not visualized Neck: supple, no paraspinal tenderness, full range of motion Back: No paraspinal tenderness Heart: regular rate and rhythm Lungs: Clear to auscultation bilaterally. Vascular: No carotid bruits. Neurological Exam: Mental status: alert and oriented to person, place, and time, recent and remote memory intact, fund of knowledge intact, attention and concentration intact, speech fluent and not  dysarthric, language intact. Cranial nerves: CN I: not tested CN II: pupils equal, round and reactive to light, visual fields intact CN III, IV, VI:  full range of motion, no nystagmus, no ptosis CN V: facial sensation intact. CN VII: upper and lower face symmetric CN VIII: hearing intact CN IX, X: gag intact, uvula midline CN XI: sternocleidomastoid and trapezius muscles intact CN XII: tongue midline Bulk & Tone: normal, no fasciculations. Motor:  muscle strength 5/5 throughout Sensation:  Pinprick, temperature and vibratory sensation intact. Deep  Tendon Reflexes:  2+ throughout,  toes downgoing.   Finger to nose testing:  Without dysmetria.   Heel to shin:  Without dysmetria.   Gait:  Normal station and stride.  Romberg negative.    Thank you for allowing me to take part in the care of this patient.  Metta Clines, DO  CC: ***

## 2021-06-14 ENCOUNTER — Ambulatory Visit: Payer: Medicare Other | Admitting: Neurology

## 2021-06-18 DIAGNOSIS — M533 Sacrococcygeal disorders, not elsewhere classified: Secondary | ICD-10-CM | POA: Diagnosis not present

## 2021-06-18 DIAGNOSIS — M5136 Other intervertebral disc degeneration, lumbar region: Secondary | ICD-10-CM | POA: Diagnosis not present

## 2021-06-18 DIAGNOSIS — Z79899 Other long term (current) drug therapy: Secondary | ICD-10-CM | POA: Diagnosis not present

## 2021-06-18 DIAGNOSIS — M5137 Other intervertebral disc degeneration, lumbosacral region: Secondary | ICD-10-CM | POA: Diagnosis not present

## 2021-06-28 DIAGNOSIS — J309 Allergic rhinitis, unspecified: Secondary | ICD-10-CM | POA: Diagnosis not present

## 2021-06-28 DIAGNOSIS — H9191 Unspecified hearing loss, right ear: Secondary | ICD-10-CM | POA: Diagnosis not present

## 2021-06-28 DIAGNOSIS — J342 Deviated nasal septum: Secondary | ICD-10-CM | POA: Diagnosis not present

## 2021-06-28 DIAGNOSIS — R519 Headache, unspecified: Secondary | ICD-10-CM | POA: Diagnosis not present

## 2021-06-28 DIAGNOSIS — H6981 Other specified disorders of Eustachian tube, right ear: Secondary | ICD-10-CM | POA: Diagnosis not present

## 2021-06-28 DIAGNOSIS — J3489 Other specified disorders of nose and nasal sinuses: Secondary | ICD-10-CM | POA: Diagnosis not present

## 2021-06-28 DIAGNOSIS — Z87898 Personal history of other specified conditions: Secondary | ICD-10-CM | POA: Diagnosis not present

## 2021-06-28 DIAGNOSIS — M2669 Other specified disorders of temporomandibular joint: Secondary | ICD-10-CM | POA: Diagnosis not present

## 2021-07-10 ENCOUNTER — Ambulatory Visit (INDEPENDENT_AMBULATORY_CARE_PROVIDER_SITE_OTHER): Payer: Medicare Other | Admitting: *Deleted

## 2021-07-10 DIAGNOSIS — J309 Allergic rhinitis, unspecified: Secondary | ICD-10-CM

## 2021-07-20 NOTE — Progress Notes (Deleted)
NEUROLOGY CONSULTATION NOTE  Dennis Hoffman MRN: [id number] DOB: [birth year]  Referring provider: Esau Grew, MD Primary care provider: Esau Grew, MD  Reason for consult:  dizziness  Assessment/Plan:   ***   Subjective:  Dennis Hoffman is a 80 year old ***-handed male with COPD, IBS, polyneuropathy, lumbar spondylosis and pudendal neuralgia who presents for dizziness.  History supplemented by referring provider's notes.  He developed sudden onset dizziness in January described as spinning and sensation of movement.  Associated with nausea and vomiting.  He had just recovered from an upper respiratory infection.  It is moderate intensity and constant.  He also notes some associated neck stiffness.  Meclizine and Zofran not effective.  Started on diazepam ***   Remote MRI of brain without contrast on 12/15/2014 personally reviewed showed minimal chronic small vessel ischemic changes within the periventricular and subcortical white matter as well as os ondantoideum variant of the C2 and dens.  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 30 mL 5   diazepam (VALIUM) 5 MG tablet Take 5 mg by mouth every 12 (twelve) hours as needed for anxiety.     dicyclomine (BENTYL) 10 MG capsule Take 10 mg by mouth 4 (four) times daily -  before meals and at bedtime.     dorzolamide (TRUSOPT) 2 % ophthalmic  solution Place 1 drop into the left eye 2 (two) times daily.     famotidine (PEPCID) 40 MG tablet Take 40 mg by mouth daily.     fexofenadine (ALLEGRA) 180 MG tablet Take 180 mg by mouth as needed.     fish oil-omega-3 fatty acids 1000 MG capsule Take 2 g by mouth daily.     fluticasone (FLONASE) 50 MCG/ACT nasal spray Place 1 spray into both nostrils daily.     gabapentin (NEURONTIN) 600 MG tablet Take by mouth.     HYDROcodone-acetaminophen (NORCO) 7.5-325 MG tablet Take by mouth.     ibuprofen (ADVIL,MOTRIN) 800 MG tablet Take 800 mg by mouth as needed.     montelukast (SINGULAIR) 10 MG tablet Take 10 mg by mouth at bedtime. Reported on 12/11/2015 (Patient not taking: Reported on 02/01/2021)     Multiple Vitamins-Minerals (CENTRUM SILVER PO) Take by mouth.     No current facility-administered medications on file prior to visit.    ALLERGIES: No Known Allergies  FAMILY HISTORY: Family History  Problem Relation Age of Onset   Diabetes Mother    Other Father        "organs shut down"   Heart disease Other     Objective:  *** General: No acute distress.  Patient appears well-groomed.   Head:  Normocephalic/atraumatic Eyes:  fundi examined but not visualized Neck: supple, no paraspinal tenderness, full range of motion Back: No paraspinal tenderness Heart: regular rate and rhythm Lungs: Clear to auscultation bilaterally.  Vascular: No carotid bruits. Neurological Exam: Mental status: alert and oriented to person, place, and time, recent and remote memory intact, fund of knowledge intact, attention and concentration intact, speech fluent and not dysarthric, language intact. Cranial nerves: CN I: not tested CN II: pupils equal, round and reactive to light, visual fields intact CN III, IV, VI:  full range of motion, no nystagmus, no ptosis CN V: facial sensation intact. CN VII: upper and lower face symmetric CN VIII: hearing intact CN IX, X: gag intact, uvula midline CN XI:  sternocleidomastoid and trapezius muscles intact CN XII: tongue midline Bulk & Tone: normal, no fasciculations. Motor:  muscle strength 5/5 throughout Sensation:  Pinprick, temperature and vibratory sensation intact. Deep Tendon Reflexes:  2+ throughout,  toes downgoing.   Finger to nose testing:  Without dysmetria.   Heel to shin:  Without dysmetria.   Gait:  Normal station and stride.  Romberg negative.    Thank you for allowing me to take part in the care of this patient.  Metta Clines, DO  CC: ***

## 2021-07-23 ENCOUNTER — Ambulatory Visit: Payer: Medicare Other | Admitting: Neurology

## 2021-08-07 ENCOUNTER — Ambulatory Visit (INDEPENDENT_AMBULATORY_CARE_PROVIDER_SITE_OTHER): Payer: Medicare Other

## 2021-08-07 DIAGNOSIS — J309 Allergic rhinitis, unspecified: Secondary | ICD-10-CM

## 2021-08-14 DIAGNOSIS — R109 Unspecified abdominal pain: Secondary | ICD-10-CM | POA: Diagnosis not present

## 2021-08-14 DIAGNOSIS — Z20822 Contact with and (suspected) exposure to covid-19: Secondary | ICD-10-CM | POA: Diagnosis not present

## 2021-08-14 DIAGNOSIS — J019 Acute sinusitis, unspecified: Secondary | ICD-10-CM | POA: Diagnosis not present

## 2021-08-16 DIAGNOSIS — J3089 Other allergic rhinitis: Secondary | ICD-10-CM | POA: Diagnosis not present

## 2021-08-16 NOTE — Progress Notes (Signed)
VIALS MADE. EXP 08-16-22 

## 2021-08-17 DIAGNOSIS — J301 Allergic rhinitis due to pollen: Secondary | ICD-10-CM | POA: Diagnosis not present

## 2021-09-03 ENCOUNTER — Ambulatory Visit (INDEPENDENT_AMBULATORY_CARE_PROVIDER_SITE_OTHER): Payer: Medicare Other | Admitting: *Deleted

## 2021-09-03 DIAGNOSIS — J309 Allergic rhinitis, unspecified: Secondary | ICD-10-CM

## 2021-09-05 DIAGNOSIS — K5792 Diverticulitis of intestine, part unspecified, without perforation or abscess without bleeding: Secondary | ICD-10-CM | POA: Diagnosis not present

## 2021-09-28 DIAGNOSIS — Z23 Encounter for immunization: Secondary | ICD-10-CM | POA: Diagnosis not present

## 2021-10-04 DIAGNOSIS — M48061 Spinal stenosis, lumbar region without neurogenic claudication: Secondary | ICD-10-CM | POA: Diagnosis not present

## 2021-10-04 DIAGNOSIS — M5137 Other intervertebral disc degeneration, lumbosacral region: Secondary | ICD-10-CM | POA: Diagnosis not present

## 2021-10-04 DIAGNOSIS — H401122 Primary open-angle glaucoma, left eye, moderate stage: Secondary | ICD-10-CM | POA: Diagnosis not present

## 2021-10-19 DIAGNOSIS — H25813 Combined forms of age-related cataract, bilateral: Secondary | ICD-10-CM | POA: Diagnosis not present

## 2021-10-19 DIAGNOSIS — H401122 Primary open-angle glaucoma, left eye, moderate stage: Secondary | ICD-10-CM | POA: Diagnosis not present

## 2021-10-22 DIAGNOSIS — Z03821 Encounter for observation for suspected ingested foreign body ruled out: Secondary | ICD-10-CM | POA: Diagnosis not present

## 2021-10-23 DIAGNOSIS — M47816 Spondylosis without myelopathy or radiculopathy, lumbar region: Secondary | ICD-10-CM | POA: Diagnosis not present

## 2021-10-30 ENCOUNTER — Ambulatory Visit (INDEPENDENT_AMBULATORY_CARE_PROVIDER_SITE_OTHER): Payer: Medicare Other

## 2021-10-30 DIAGNOSIS — J309 Allergic rhinitis, unspecified: Secondary | ICD-10-CM

## 2021-11-27 ENCOUNTER — Ambulatory Visit (INDEPENDENT_AMBULATORY_CARE_PROVIDER_SITE_OTHER): Payer: Medicare Other | Admitting: *Deleted

## 2021-11-27 DIAGNOSIS — J309 Allergic rhinitis, unspecified: Secondary | ICD-10-CM

## 2021-12-04 DIAGNOSIS — R1032 Left lower quadrant pain: Secondary | ICD-10-CM | POA: Diagnosis not present

## 2021-12-04 DIAGNOSIS — J3489 Other specified disorders of nose and nasal sinuses: Secondary | ICD-10-CM | POA: Diagnosis not present

## 2021-12-04 DIAGNOSIS — R0982 Postnasal drip: Secondary | ICD-10-CM | POA: Diagnosis not present

## 2021-12-11 ENCOUNTER — Ambulatory Visit (INDEPENDENT_AMBULATORY_CARE_PROVIDER_SITE_OTHER): Payer: Medicare Other | Admitting: *Deleted

## 2021-12-11 DIAGNOSIS — J309 Allergic rhinitis, unspecified: Secondary | ICD-10-CM

## 2022-01-10 ENCOUNTER — Ambulatory Visit (INDEPENDENT_AMBULATORY_CARE_PROVIDER_SITE_OTHER): Payer: Medicare Other | Admitting: *Deleted

## 2022-01-10 DIAGNOSIS — J309 Allergic rhinitis, unspecified: Secondary | ICD-10-CM

## 2022-01-28 ENCOUNTER — Ambulatory Visit (INDEPENDENT_AMBULATORY_CARE_PROVIDER_SITE_OTHER): Payer: Medicare Other | Admitting: *Deleted

## 2022-01-28 DIAGNOSIS — J309 Allergic rhinitis, unspecified: Secondary | ICD-10-CM

## 2022-02-18 ENCOUNTER — Ambulatory Visit (INDEPENDENT_AMBULATORY_CARE_PROVIDER_SITE_OTHER): Payer: Medicare Other | Admitting: *Deleted

## 2022-02-18 DIAGNOSIS — J309 Allergic rhinitis, unspecified: Secondary | ICD-10-CM

## 2022-03-11 ENCOUNTER — Ambulatory Visit (INDEPENDENT_AMBULATORY_CARE_PROVIDER_SITE_OTHER): Payer: Medicare Other | Admitting: *Deleted

## 2022-03-11 DIAGNOSIS — J309 Allergic rhinitis, unspecified: Secondary | ICD-10-CM

## 2022-03-13 DIAGNOSIS — H25813 Combined forms of age-related cataract, bilateral: Secondary | ICD-10-CM | POA: Diagnosis not present

## 2022-03-13 DIAGNOSIS — H401122 Primary open-angle glaucoma, left eye, moderate stage: Secondary | ICD-10-CM | POA: Diagnosis not present

## 2022-03-21 DIAGNOSIS — M545 Low back pain, unspecified: Secondary | ICD-10-CM | POA: Diagnosis not present

## 2022-03-21 DIAGNOSIS — J329 Chronic sinusitis, unspecified: Secondary | ICD-10-CM | POA: Diagnosis not present

## 2022-03-26 ENCOUNTER — Ambulatory Visit (INDEPENDENT_AMBULATORY_CARE_PROVIDER_SITE_OTHER): Payer: Medicare Other | Admitting: *Deleted

## 2022-03-26 DIAGNOSIS — J309 Allergic rhinitis, unspecified: Secondary | ICD-10-CM | POA: Diagnosis not present

## 2022-04-11 DIAGNOSIS — M5416 Radiculopathy, lumbar region: Secondary | ICD-10-CM | POA: Diagnosis not present

## 2022-04-16 ENCOUNTER — Ambulatory Visit (INDEPENDENT_AMBULATORY_CARE_PROVIDER_SITE_OTHER): Payer: Medicare Other | Admitting: *Deleted

## 2022-04-16 DIAGNOSIS — J309 Allergic rhinitis, unspecified: Secondary | ICD-10-CM

## 2022-04-26 DIAGNOSIS — H401122 Primary open-angle glaucoma, left eye, moderate stage: Secondary | ICD-10-CM | POA: Diagnosis not present

## 2022-04-26 DIAGNOSIS — H25813 Combined forms of age-related cataract, bilateral: Secondary | ICD-10-CM | POA: Diagnosis not present

## 2022-05-06 DIAGNOSIS — J329 Chronic sinusitis, unspecified: Secondary | ICD-10-CM | POA: Diagnosis not present

## 2022-05-06 DIAGNOSIS — R519 Headache, unspecified: Secondary | ICD-10-CM | POA: Diagnosis not present

## 2022-05-14 ENCOUNTER — Ambulatory Visit (INDEPENDENT_AMBULATORY_CARE_PROVIDER_SITE_OTHER): Payer: Medicare Other | Admitting: *Deleted

## 2022-05-14 DIAGNOSIS — J309 Allergic rhinitis, unspecified: Secondary | ICD-10-CM

## 2022-06-10 ENCOUNTER — Ambulatory Visit (INDEPENDENT_AMBULATORY_CARE_PROVIDER_SITE_OTHER): Payer: Medicare Other | Admitting: *Deleted

## 2022-06-10 DIAGNOSIS — J309 Allergic rhinitis, unspecified: Secondary | ICD-10-CM

## 2022-06-11 DIAGNOSIS — J3089 Other allergic rhinitis: Secondary | ICD-10-CM | POA: Diagnosis not present

## 2022-06-11 NOTE — Progress Notes (Signed)
VIALS EXP 06-12-23

## 2022-06-12 DIAGNOSIS — J3081 Allergic rhinitis due to animal (cat) (dog) hair and dander: Secondary | ICD-10-CM | POA: Diagnosis not present

## 2022-07-08 ENCOUNTER — Ambulatory Visit (INDEPENDENT_AMBULATORY_CARE_PROVIDER_SITE_OTHER): Payer: Medicare Other | Admitting: *Deleted

## 2022-07-08 DIAGNOSIS — J309 Allergic rhinitis, unspecified: Secondary | ICD-10-CM

## 2022-07-11 DIAGNOSIS — J329 Chronic sinusitis, unspecified: Secondary | ICD-10-CM | POA: Diagnosis not present

## 2022-07-23 DIAGNOSIS — L578 Other skin changes due to chronic exposure to nonionizing radiation: Secondary | ICD-10-CM | POA: Diagnosis not present

## 2022-07-23 DIAGNOSIS — L821 Other seborrheic keratosis: Secondary | ICD-10-CM | POA: Diagnosis not present

## 2022-07-23 DIAGNOSIS — B079 Viral wart, unspecified: Secondary | ICD-10-CM | POA: Diagnosis not present

## 2022-07-23 DIAGNOSIS — L820 Inflamed seborrheic keratosis: Secondary | ICD-10-CM | POA: Diagnosis not present

## 2022-07-23 DIAGNOSIS — L570 Actinic keratosis: Secondary | ICD-10-CM | POA: Diagnosis not present

## 2022-07-24 DIAGNOSIS — Z0000 Encounter for general adult medical examination without abnormal findings: Secondary | ICD-10-CM | POA: Diagnosis not present

## 2022-07-24 DIAGNOSIS — Z9181 History of falling: Secondary | ICD-10-CM | POA: Diagnosis not present

## 2022-08-08 ENCOUNTER — Ambulatory Visit: Payer: Medicare Other | Admitting: Allergy and Immunology

## 2022-08-08 ENCOUNTER — Encounter: Payer: Self-pay | Admitting: Allergy and Immunology

## 2022-08-08 VITALS — BP 106/62 | HR 60 | Resp 12 | Ht 69.0 in | Wt 155.4 lb

## 2022-08-08 DIAGNOSIS — J3089 Other allergic rhinitis: Secondary | ICD-10-CM | POA: Diagnosis not present

## 2022-08-08 DIAGNOSIS — J301 Allergic rhinitis due to pollen: Secondary | ICD-10-CM

## 2022-08-08 DIAGNOSIS — H04123 Dry eye syndrome of bilateral lacrimal glands: Secondary | ICD-10-CM

## 2022-08-08 DIAGNOSIS — K219 Gastro-esophageal reflux disease without esophagitis: Secondary | ICD-10-CM | POA: Diagnosis not present

## 2022-08-08 MED ORDER — RYALTRIS 665-25 MCG/ACT NA SUSP: NASAL | 5 refills | Status: DC

## 2022-08-08 NOTE — Patient Instructions (Addendum)
  1.  Treat and prevent inflammation:   A. Ryaltris - 2 sprays each nostril 1-2 times per day  B. Discontinue Immunotherapy   2. Treat and prevent reflux / LPR:   A. Consolidate all caffeine consumption  B. Omeprazole 40 mg - 1 tablet in AM  C. Famotidine 40 mg - 1 tablet in PM  3. If needed:   A. Nasal saline  B. Liquid tears  C. OTC hydrocortisone.  4. Do not use oral antihistamines secondary to dry eye syndrome  5. Return to clinic in 4 weeks or earlier if problem  6. Plan for fall flu vaccine and RSV vaccine

## 2022-08-08 NOTE — Progress Notes (Signed)
[facility] - [place] - [place]   Follow-up Note  Referring Provider: Nicoletta Dress, MD Primary Provider: Nicoletta Dress, MD Date of Office Visit: 08/08/2022  Subjective:   Dennis Hoffman (DOB: June 14, 1941) is a 81 y.o. male who returns to the [facility] on 08/08/2022 in re-evaluation of the following:  HPI: Dennis Hoffman returns to this clinic in evaluation of rhinitis and LPR and dry eye syndrome.  His last visit to this clinic was 01 February 2021.  He has done better regarding his nose and has very little issues with congestion but he still has a lot of drainage in his throat.  In addition, he has some classic reflux symptoms with burping and regurgitation.  He is not using his nasal steroid on a consistent basis.  He has not using a proton pump inhibitor on a consistent basis.  He does use famotidine every night and he continues to use immunotherapy currently at every 4 weeks without any adverse effect.  He has completed 5 years of immunotherapy.  Allergies as of 08/08/2022   No Known Allergies      Medication List    azelastine 0.1 % nasal spray Commonly known as: ASTELIN Can use one spray in each nostril twice daily if needed.   CENTRUM SILVER PO Take by mouth.   diazepam 5 MG tablet Commonly known as: VALIUM Take 5 mg by mouth every 12 (twelve) hours as needed for anxiety.   dicyclomine 10 MG capsule Commonly known as: BENTYL Take 10 mg by mouth 4 (four) times daily -  before meals and at bedtime.   dorzolamide-timolol 22.3-6.8 MG/ML ophthalmic solution Commonly known as: COSOPT Place 1 drop into the left eye 2 (two) times daily.   famotidine 40 MG tablet Commonly known as: PEPCID Take 40 mg by mouth daily.   fish oil-omega-3 fatty acids 1000 MG capsule Take 2 g by mouth daily.   fluticasone 50 MCG/ACT nasal spray Commonly known as: FLONASE Place 1 spray into both nostrils daily.   gabapentin 600 MG  tablet Commonly known as: NEURONTIN Take by mouth.   HYDROcodone-acetaminophen 7.5-325 MG tablet Commonly known as: NORCO Take by mouth.   ibuprofen 800 MG tablet Commonly known as: ADVIL Take 800 mg by mouth as needed.    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   HENT: Negative.    Eyes: Negative.   Respiratory: Negative.    Cardiovascular: Negative.   Gastrointestinal: Negative.   Genitourinary: Negative.   Musculoskeletal: Negative.   Skin: Negative.   Neurological: Negative.   Endo/Heme/Allergies: Negative.   Psychiatric/Behavioral: Negative.       Objective:   Vitals:   08/08/22 0819  BP: 106/62  Pulse: 60  Resp: 12   Height: '5\' 9"'$  (175.3 cm)  Weight: 155 lb 6.4 oz (70.5 kg)   Physical Exam Constitutional:      Appearance: He is not diaphoretic.  HENT:     Head: Normocephalic.     Right Ear: Tympanic membrane, ear canal and external ear normal.     Left Ear: Tympanic membrane, ear canal and external ear normal.     Nose: Nose normal. No mucosal edema or rhinorrhea.     Mouth/Throat:     Pharynx: Uvula midline. No oropharyngeal exudate.  Eyes:     Conjunctiva/sclera: Conjunctivae normal.  Neck:     Thyroid: No thyromegaly.     Trachea: Trachea normal. No tracheal tenderness or tracheal deviation.  Cardiovascular:     Rate and Rhythm: Normal rate and regular rhythm.     Heart sounds: Normal heart sounds, S1 normal and S2 normal. No murmur heard. Pulmonary:     Effort: No respiratory  distress.     Breath sounds: Normal breath sounds. No stridor. No wheezing or rales.  Lymphadenopathy:     Head:     Right side of head: No tonsillar adenopathy.     Left side of head: No tonsillar adenopathy.     Cervical: No cervical adenopathy.  Skin:    Findings: No erythema or rash.     Nails: There is no clubbing.  Neurological:     Mental Status: He is alert.     Diagnostics: none  Assessment and Plan:   1. Perennial allergic rhinitis   2. Seasonal allergic rhinitis due to pollen   3. LPRD (laryngopharyngeal reflux disease)   4. Dry eye syndrome of both eyes     1.  Treat and prevent inflammation:   A. Ryaltris - 2 sprays each nostril 1-2 time per day  B. Discontinue Immunotherapy   2. Treat and prevent reflux / LPR:   A. Consolidate all caffeine consumption  B. Omeprazole 40 mg - 1 tablet in AM  B. Famotidine 40 mg - 1 tablet in PM  3. If needed:   A. Nasal saline  B. Liquid tears  C. OTC hydrocortisone.  4. Do not use oral antihistamines secondary to dry eye syndrome  5. Return to clinic in 4 weeks or earlier if problem  6. Plan for fall flu vaccine and RSV vaccine  I think that Dennis Hoffman has received maximal benefit from his immunotherapy as he has been using this form of therapy for the past 5 years.  He still has lots of drainage and its always been an issue whether or not his drainage is secondary to his atopic disease or his reflux induced respiratory disease.  I encouraged him to consistently use his omeprazole in conjunction with his famotidine to address his LPR and will give him a combination nasal spray and will see what type of effect we receive with this combination in 4 weeks.  Allena Katz, MD Allergy / Immunology [place]

## 2022-09-02 ENCOUNTER — Encounter: Payer: Self-pay | Admitting: Allergy and Immunology

## 2022-09-02 ENCOUNTER — Ambulatory Visit: Payer: Medicare Other | Admitting: Allergy and Immunology

## 2022-09-02 VITALS — BP 108/62 | HR 67 | Resp 16

## 2022-09-02 DIAGNOSIS — J301 Allergic rhinitis due to pollen: Secondary | ICD-10-CM

## 2022-09-02 DIAGNOSIS — J3089 Other allergic rhinitis: Secondary | ICD-10-CM | POA: Diagnosis not present

## 2022-09-02 DIAGNOSIS — J069 Acute upper respiratory infection, unspecified: Secondary | ICD-10-CM | POA: Diagnosis not present

## 2022-09-02 DIAGNOSIS — K219 Gastro-esophageal reflux disease without esophagitis: Secondary | ICD-10-CM

## 2022-09-02 DIAGNOSIS — H04123 Dry eye syndrome of bilateral lacrimal glands: Secondary | ICD-10-CM | POA: Diagnosis not present

## 2022-09-02 MED ORDER — METHYLPREDNISOLONE ACETATE 80 MG/ML IJ SUSP
80.0000 mg | Freq: Once | INTRAMUSCULAR | Status: AC
  Administered 2022-09-02: 80 mg via INTRAMUSCULAR

## 2022-09-02 MED ORDER — IPRATROPIUM BROMIDE 0.06 % NA SOLN: 2.0000 | Freq: Four times a day (QID) | NASAL | 2 refills | Status: DC

## 2022-09-02 MED ORDER — AZITHROMYCIN 500 MG PO TABS: 500.0000 mg | ORAL_TABLET | Freq: Every day | ORAL | 0 refills | Status: AC

## 2022-09-02 NOTE — Progress Notes (Signed)
[facility]   Follow-up Note  Referring Provider: Nicoletta Dress, MD Primary Provider: Nicoletta Dress, MD Date of Office Visit: 09/02/2022  Subjective:   Dennis Hoffman (DOB: Mar 14, 1941) is a 81 y.o. male who returns to the [facility] on 09/02/2022 in re-evaluation of the following:  HPI: Dennis Hoffman returns to this clinic in evaluation of rhinitis, LPR, and dry eye syndrome.  His last visit to this clinic was 08 August 2022.  During his last visit he complained of having classic reflux symptoms and some throat issues.  We asked him to use a combination nasal spray and to increase his therapy for reflux.  He does have a memory issue and is hard for him to use medications on a consistent basis and he admits that he has not been consistently using these medications.  And his assessment today is clouded by the fact that over the course of the past 48 hours he has had an acute infection with head congestion and sneezing and rhinorrhea and some green postnasal drip and possibly some green sputum associated with a cough.  He does not have any fever or chills or chest pain or head pain and he can still smell.  Allergies as of 09/02/2022   No Known Allergies      Medication List    CENTRUM SILVER PO Take by mouth.   diazepam 5 MG tablet Commonly known as: VALIUM Take 5 mg by mouth every 12 (twelve) hours as needed for anxiety.   dicyclomine 10 MG capsule Commonly known as: BENTYL Take 10 mg by mouth 4 (four) times daily -  before meals and at bedtime.   dorzolamide-timolol 22.3-6.8 MG/ML ophthalmic solution Commonly known as: COSOPT Place 1 drop into the left eye 2 (two) times daily.   famotidine 40 MG tablet Commonly known as: PEPCID Take 40 mg by mouth daily.   fish oil-omega-3 fatty acids 1000 MG capsule Take 2 g by mouth daily.   gabapentin 600 MG tablet Commonly known as: NEURONTIN Take by mouth.    HYDROcodone-acetaminophen 7.5-325 MG tablet Commonly known as: NORCO Take by mouth.   ibuprofen 800 MG tablet Commonly known as: ADVIL Take 800 mg by mouth as needed.   montelukast 10 MG tablet Commonly known as: SINGULAIR Take 10 mg by mouth at bedtime. Reported on 12/11/2015   Ryaltris 465-03 MCG/ACT Susp Generic drug: Olopatadine-Mometasone Use two sprays in each nostril one to two times per day as directed.    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   HENT: Negative.    Eyes: Negative.   Respiratory: Negative.    Cardiovascular: Negative.   Gastrointestinal: Negative.   Genitourinary: Negative.   Musculoskeletal: Negative.   Skin: Negative.   Neurological: Negative.   Endo/Heme/Allergies: Negative.   Psychiatric/Behavioral: Negative.       Objective:   Vitals:   09/02/22 1119  BP:  108/62  Pulse: 67  Resp: 16  SpO2: 98%          Physical Exam Constitutional:      Appearance: He is not diaphoretic.     Comments: Nasal voice  HENT:     Head: Normocephalic.     Right Ear: Tympanic membrane, ear canal and external ear normal.     Left Ear: Tympanic membrane, ear canal and external ear normal.     Nose: Nose normal. No mucosal edema or rhinorrhea.     Mouth/Throat:     Pharynx: Uvula midline. No oropharyngeal exudate.  Eyes:     Conjunctiva/sclera: Conjunctivae normal.  Neck:     Thyroid: No thyromegaly.     Trachea: Trachea normal. No tracheal tenderness or tracheal deviation.   Cardiovascular:     Rate and Rhythm: Normal rate and regular rhythm.     Heart sounds: Normal heart sounds, S1 normal and S2 normal. No murmur heard. Pulmonary:     Effort: No respiratory distress.     Breath sounds: Normal breath sounds. No stridor. No wheezing or rales.  Lymphadenopathy:     Head:     Right side of head: No tonsillar adenopathy.     Left side of head: No tonsillar adenopathy.     Cervical: No cervical adenopathy.  Skin:    Findings: No erythema or rash.     Nails: There is no clubbing.  Neurological:     Mental Status: He is alert.     Diagnostics: none  Assessment and Plan:   1. Perennial allergic rhinitis   2. Seasonal allergic rhinitis due to pollen   3. LPRD (laryngopharyngeal reflux disease)   4. Dry eye syndrome of both eyes   5. Viral upper respiratory tract infection with cough    1.  Treat and prevent inflammation:   A. Ryaltris - 2 sprays each nostril 1-2 times per day  B.  Depo-Medrol 80 IM delivered in clinic today  2. Treat and prevent reflux / LPR:   A. Consolidate all caffeine consumption  B. Omeprazole 40 mg - 1 tablet in AM  C. Famotidine 40 mg - 1 tablet in PM  3. Treat acute infection:   A.  Azithromycin 500 mg -1 tablet 1 time per day for 3 days only  B.  Nasal ipratropium 0.06% -2 sprays each nostril every 6 hours if needed  C.  OTC ibuprofen if needed  4. If needed:   A. Nasal saline  B. Liquid tears  C. OTC hydrocortisone.  5. Do not use oral antihistamines secondary to dry eye syndrome  6. Return to clinic in 4 weeks or earlier if problem  7. Plan for fall flu vaccine and RSV vaccine  Dennis Hoffman will utilize the plan noted above to address inflammation of his airway which appears to be precipitated by an infectious disease over the course of the past several days and as well his chronic irritation and inflammation of his airway which is probably a combination of his allergic disease and his reflux disease and his dry  mucosal membrane issue.  I will see him back in this clinic in 4 weeks or earlier if there is a problem.  Dennis Katz, MD [facility] [place]

## 2022-09-02 NOTE — Patient Instructions (Addendum)
  1.  Treat and prevent inflammation:   A. Ryaltris - 2 sprays each nostril 1-2 times per day  B.  Depo-Medrol 80 IM delivered in clinic today  2. Treat and prevent reflux / LPR:   A. Consolidate all caffeine consumption  B. Omeprazole 40 mg - 1 tablet in AM  C. Famotidine 40 mg - 1 tablet in PM  3. Treat acute infection:   A.  Azithromycin 500 mg -1 tablet 1 time per day for 3 days only  B.  Nasal ipratropium 0.06% -2 sprays each nostril every 6 hours if needed  C.  OTC ibuprofen if needed  4. If needed:   A. Nasal saline  B. Liquid tears  C. OTC hydrocortisone.  5. Do not use oral antihistamines secondary to dry eye syndrome  6. Return to clinic in 4 weeks or earlier if problem  7. Plan for fall flu vaccine and RSV vaccine

## 2022-09-03 ENCOUNTER — Encounter: Payer: Self-pay | Admitting: Allergy and Immunology

## 2022-09-09 ENCOUNTER — Telehealth: Payer: Self-pay | Admitting: *Deleted

## 2022-09-09 NOTE — Telephone Encounter (Signed)
PA approved and faxed to pharmacy:

## 2022-09-09 NOTE — Telephone Encounter (Signed)
PA is pending for Ryaltris on CoverMyMeds.

## 2022-09-11 ENCOUNTER — Telehealth: Payer: Self-pay | Admitting: Allergy and Immunology

## 2022-09-11 NOTE — Telephone Encounter (Signed)
Patient states he is coughing up "yellowish, greenish" mucous and has the same color in nasal discharge. He mentioned he is also passing a "green colored" mucous from his rectum.

## 2022-09-11 NOTE — Telephone Encounter (Signed)
Spoke with [name] and let him know that we will work on getting this approved with insurance and scheduled.

## 2022-09-16 DIAGNOSIS — J019 Acute sinusitis, unspecified: Secondary | ICD-10-CM | POA: Diagnosis not present

## 2022-09-16 NOTE — Telephone Encounter (Signed)
Approved and scheduled. Dennis Hoffman has been informed.

## 2022-10-02 ENCOUNTER — Other Ambulatory Visit: Payer: Self-pay

## 2022-10-02 MED ORDER — RYALTRIS 665-25 MCG/ACT NA SUSP: NASAL | 1 refills | Status: AC

## 2022-10-14 DIAGNOSIS — M5137 Other intervertebral disc degeneration, lumbosacral region: Secondary | ICD-10-CM | POA: Diagnosis not present

## 2022-10-18 DIAGNOSIS — H25813 Combined forms of age-related cataract, bilateral: Secondary | ICD-10-CM | POA: Diagnosis not present

## 2022-10-18 DIAGNOSIS — H401122 Primary open-angle glaucoma, left eye, moderate stage: Secondary | ICD-10-CM | POA: Diagnosis not present

## 2022-10-29 DIAGNOSIS — M5416 Radiculopathy, lumbar region: Secondary | ICD-10-CM | POA: Diagnosis not present

## 2022-11-12 DIAGNOSIS — M1712 Unilateral primary osteoarthritis, left knee: Secondary | ICD-10-CM | POA: Diagnosis not present

## 2022-11-12 DIAGNOSIS — M1612 Unilateral primary osteoarthritis, left hip: Secondary | ICD-10-CM | POA: Diagnosis not present

## 2022-11-18 DIAGNOSIS — M542 Cervicalgia: Secondary | ICD-10-CM | POA: Diagnosis not present

## 2022-11-18 DIAGNOSIS — M25511 Pain in right shoulder: Secondary | ICD-10-CM | POA: Diagnosis not present

## 2022-11-26 DIAGNOSIS — Z23 Encounter for immunization: Secondary | ICD-10-CM | POA: Diagnosis not present

## 2022-11-27 DIAGNOSIS — M5136 Other intervertebral disc degeneration, lumbar region: Secondary | ICD-10-CM | POA: Diagnosis not present

## 2022-11-27 DIAGNOSIS — M5030 Other cervical disc degeneration, unspecified cervical region: Secondary | ICD-10-CM | POA: Diagnosis not present

## 2022-11-27 DIAGNOSIS — M961 Postlaminectomy syndrome, not elsewhere classified: Secondary | ICD-10-CM | POA: Diagnosis not present

## 2022-12-10 DIAGNOSIS — D171 Benign lipomatous neoplasm of skin and subcutaneous tissue of trunk: Secondary | ICD-10-CM | POA: Diagnosis not present

## 2022-12-10 DIAGNOSIS — D1721 Benign lipomatous neoplasm of skin and subcutaneous tissue of right arm: Secondary | ICD-10-CM | POA: Diagnosis not present

## 2022-12-14 DIAGNOSIS — M542 Cervicalgia: Secondary | ICD-10-CM | POA: Diagnosis not present

## 2022-12-17 DIAGNOSIS — M1712 Unilateral primary osteoarthritis, left knee: Secondary | ICD-10-CM | POA: Diagnosis not present

## 2022-12-17 DIAGNOSIS — M1612 Unilateral primary osteoarthritis, left hip: Secondary | ICD-10-CM | POA: Diagnosis not present

## 2022-12-21 DIAGNOSIS — M5030 Other cervical disc degeneration, unspecified cervical region: Secondary | ICD-10-CM | POA: Diagnosis not present

## 2022-12-21 DIAGNOSIS — M5416 Radiculopathy, lumbar region: Secondary | ICD-10-CM | POA: Diagnosis not present

## 2022-12-21 DIAGNOSIS — M47896 Other spondylosis, lumbar region: Secondary | ICD-10-CM | POA: Diagnosis not present

## 2022-12-21 DIAGNOSIS — M961 Postlaminectomy syndrome, not elsewhere classified: Secondary | ICD-10-CM | POA: Diagnosis not present

## 2022-12-21 DIAGNOSIS — M5412 Radiculopathy, cervical region: Secondary | ICD-10-CM | POA: Diagnosis not present

## 2022-12-31 DIAGNOSIS — M5412 Radiculopathy, cervical region: Secondary | ICD-10-CM | POA: Diagnosis not present

## 2023-01-13 DIAGNOSIS — Z23 Encounter for immunization: Secondary | ICD-10-CM | POA: Diagnosis not present

## 2023-01-13 DIAGNOSIS — S81801A Unspecified open wound, right lower leg, initial encounter: Secondary | ICD-10-CM | POA: Diagnosis not present

## 2023-01-20 DIAGNOSIS — M5412 Radiculopathy, cervical region: Secondary | ICD-10-CM | POA: Diagnosis not present

## 2023-02-04 DIAGNOSIS — M25552 Pain in left hip: Secondary | ICD-10-CM | POA: Diagnosis not present

## 2023-02-04 DIAGNOSIS — M6281 Muscle weakness (generalized): Secondary | ICD-10-CM | POA: Diagnosis not present

## 2023-02-06 DIAGNOSIS — R21 Rash and other nonspecific skin eruption: Secondary | ICD-10-CM | POA: Diagnosis not present

## 2023-02-06 DIAGNOSIS — M5137 Other intervertebral disc degeneration, lumbosacral region: Secondary | ICD-10-CM | POA: Diagnosis not present

## 2023-02-06 DIAGNOSIS — K219 Gastro-esophageal reflux disease without esophagitis: Secondary | ICD-10-CM | POA: Diagnosis not present

## 2023-02-06 DIAGNOSIS — E785 Hyperlipidemia, unspecified: Secondary | ICD-10-CM | POA: Diagnosis not present

## 2023-02-06 DIAGNOSIS — M48062 Spinal stenosis, lumbar region with neurogenic claudication: Secondary | ICD-10-CM | POA: Diagnosis not present

## 2023-02-06 DIAGNOSIS — K581 Irritable bowel syndrome with constipation: Secondary | ICD-10-CM | POA: Diagnosis not present

## 2023-02-06 DIAGNOSIS — J302 Other seasonal allergic rhinitis: Secondary | ICD-10-CM | POA: Diagnosis not present

## 2023-02-06 DIAGNOSIS — K449 Diaphragmatic hernia without obstruction or gangrene: Secondary | ICD-10-CM | POA: Diagnosis not present

## 2023-02-11 DIAGNOSIS — M25552 Pain in left hip: Secondary | ICD-10-CM | POA: Diagnosis not present

## 2023-02-11 DIAGNOSIS — M6281 Muscle weakness (generalized): Secondary | ICD-10-CM | POA: Diagnosis not present

## 2023-05-15 ENCOUNTER — Other Ambulatory Visit: Payer: Self-pay | Admitting: Allergy and Immunology

## 2023-05-16 DIAGNOSIS — H401122 Primary open-angle glaucoma, left eye, moderate stage: Secondary | ICD-10-CM | POA: Diagnosis not present

## 2023-05-16 DIAGNOSIS — H25813 Combined forms of age-related cataract, bilateral: Secondary | ICD-10-CM | POA: Diagnosis not present

## 2023-08-12 DIAGNOSIS — E785 Hyperlipidemia, unspecified: Secondary | ICD-10-CM | POA: Diagnosis not present

## 2023-08-12 DIAGNOSIS — Z0000 Encounter for general adult medical examination without abnormal findings: Secondary | ICD-10-CM | POA: Diagnosis not present

## 2023-08-14 DIAGNOSIS — E785 Hyperlipidemia, unspecified: Secondary | ICD-10-CM | POA: Diagnosis not present

## 2023-09-09 ENCOUNTER — Ambulatory Visit: Payer: Medicare Other | Admitting: Podiatry

## 2023-09-09 ENCOUNTER — Encounter: Payer: Self-pay | Admitting: Podiatry

## 2023-09-09 DIAGNOSIS — M19071 Primary osteoarthritis, right ankle and foot: Secondary | ICD-10-CM

## 2023-09-09 NOTE — Progress Notes (Signed)
  Subjective:  Patient ID: Dennis Hoffman, male    DOB: 19-Feb-1941,  MRN: [id number]  Chief Complaint  Patient presents with   Ankle Pain    Hx remote ankle dislocation at age 82. Seen Dr. Marylene Land in 2020. C/o right ankle pain and swelling with occasional instability with some activity    82 y.o. male presents with concern for pain in the right anterior lateral ankle.  Has a history of prior ankle dislocation at age 38.  Saw Dr. Marylene Land in 2020 was given a steroid injection and thinks it did help with his pain.  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 Nails are of normal length thickness and coloration.   Vascular:  Dorsalis Pedis artery and Posterior Tibial artery pedal pulses are 2/4 bilateral.  Capillary fill time < 3 sec to all digits.   Neruologic: Grossly intact via light touch bilateral. Protective threshold intact to all sites bilateral.   Musculoskeletal: Pain with palpation of the right anterior lateral ankle joint increased with range of motion.  Gait: Unassisted, Nonantalgic.   No images are attached to the encounter.  Radiographs:  Deferred Assessment:   1. Arthritis of right ankle      Plan:  Patient was evaluated and treated and all questions answered.  # Osteoarthritis right ankle joint -Recommend steroid injection due to concern for osteoarthritis in the  right ankle -After sterile prep injected 1 cc half percent Marcaine plain with 1 cc Kenalog 10 into the anterior lateral aspect of the tibiotalar joint on the right ankle -Recommend anti-inflammatory medications as needed as well as icing and decreasing activity -Patient has an ankle brace recommend use of this when he is on uneven surface         Corinna Gab, DPM [facility] / [facility]

## 2023-09-10 DIAGNOSIS — Z23 Encounter for immunization: Secondary | ICD-10-CM | POA: Diagnosis not present

## 2023-09-10 DIAGNOSIS — K580 Irritable bowel syndrome with diarrhea: Secondary | ICD-10-CM | POA: Diagnosis not present

## 2023-10-13 DIAGNOSIS — R350 Frequency of micturition: Secondary | ICD-10-CM | POA: Diagnosis not present

## 2023-10-13 DIAGNOSIS — J302 Other seasonal allergic rhinitis: Secondary | ICD-10-CM | POA: Diagnosis not present

## 2023-10-13 DIAGNOSIS — H1010 Acute atopic conjunctivitis, unspecified eye: Secondary | ICD-10-CM | POA: Diagnosis not present

## 2023-10-14 ENCOUNTER — Ambulatory Visit: Payer: Medicare Other | Admitting: Podiatry

## 2023-10-14 DIAGNOSIS — M19071 Primary osteoarthritis, right ankle and foot: Secondary | ICD-10-CM | POA: Diagnosis not present

## 2023-10-14 NOTE — Progress Notes (Signed)
  Subjective:  Patient ID: Dennis Hoffman, male    DOB: 25-Dec-1940,  MRN: [id number]  Chief Complaint  Patient presents with   Follow-up    Doing much better since receiving the injection. Some pain today, rates it as 2-3 out of 10. More aching than anything.     82 y.o. male presents for follow up R anterior lateral ankle pain.  He states that this did not chart he had at last appointment really helped a lot.  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 Nails are of normal length thickness and coloration.   Vascular:  Dorsalis Pedis artery and Posterior Tibial artery pedal pulses are 2/4 bilateral.  Capillary fill time < 3 sec to all digits.   Neruologic: Grossly intact via light touch bilateral. Protective threshold intact to all sites bilateral.   Musculoskeletal: Decreased pain with palpation of the right anterior lateral ankle joint increased with range of motion.  Gait: Unassisted, Nonantalgic.   No images are attached to the encounter.  Radiographs:  Deferred Assessment:   1. Arthritis of right ankle       Plan:  Patient was evaluated and treated and all questions answered.  # Osteoarthritis right ankle joint -Recommend repeat steroid injection due to concern for osteoarthritis in the right ankle -After sterile prep injected 1 cc half percent Marcaine plain with 1 cc Kenalog 10 into the  anterior lateral aspect of the tibiotalar joint on the right ankle -Recommend anti-inflammatory medications as needed as well as icing and decreasing activity -Patient has an ankle brace recommend use of this when he is on uneven surface         Corinna Gab, DPM [facility] / [facility]

## 2023-10-28 DIAGNOSIS — Z139 Encounter for screening, unspecified: Secondary | ICD-10-CM | POA: Diagnosis not present

## 2023-10-28 DIAGNOSIS — Z9181 History of falling: Secondary | ICD-10-CM | POA: Diagnosis not present

## 2023-10-28 DIAGNOSIS — Z0000 Encounter for general adult medical examination without abnormal findings: Secondary | ICD-10-CM | POA: Diagnosis not present

## 2023-11-10 DIAGNOSIS — S39011A Strain of muscle, fascia and tendon of abdomen, initial encounter: Secondary | ICD-10-CM | POA: Diagnosis not present

## 2023-11-10 DIAGNOSIS — R141 Gas pain: Secondary | ICD-10-CM | POA: Diagnosis not present

## 2023-11-17 DIAGNOSIS — K219 Gastro-esophageal reflux disease without esophagitis: Secondary | ICD-10-CM | POA: Diagnosis not present

## 2023-11-17 DIAGNOSIS — R1030 Lower abdominal pain, unspecified: Secondary | ICD-10-CM | POA: Diagnosis not present

## 2023-11-17 DIAGNOSIS — K5900 Constipation, unspecified: Secondary | ICD-10-CM | POA: Diagnosis not present

## 2023-11-19 DIAGNOSIS — H2513 Age-related nuclear cataract, bilateral: Secondary | ICD-10-CM | POA: Diagnosis not present

## 2023-11-19 DIAGNOSIS — H401122 Primary open-angle glaucoma, left eye, moderate stage: Secondary | ICD-10-CM | POA: Diagnosis not present

## 2023-11-29 DIAGNOSIS — J410 Simple chronic bronchitis: Secondary | ICD-10-CM | POA: Diagnosis not present

## 2023-11-29 DIAGNOSIS — R051 Acute cough: Secondary | ICD-10-CM | POA: Diagnosis not present

## 2023-11-29 DIAGNOSIS — R0981 Nasal congestion: Secondary | ICD-10-CM | POA: Diagnosis not present

## 2024-01-08 ENCOUNTER — Ambulatory Visit: Payer: Medicare Other | Admitting: Podiatry

## 2024-01-08 DIAGNOSIS — M19071 Primary osteoarthritis, right ankle and foot: Secondary | ICD-10-CM | POA: Diagnosis not present

## 2024-01-08 NOTE — Progress Notes (Signed)
     Chief Complaint  Patient presents with   Ankle Pain    Right ankle pain, seen Dr Malvin gave him injection. He would like repeat injection today. His ankle is hurting all the time and hurts to walk and stand. He has been using Volteran gel for a few days and that seems to help a little, he has also been soaking in epsom salts. Not diabetic and no anticoags.    HPI: 83 y.o. male presents today with right ankle pain.  Pain is mostly along anterolateral ankle area when he points to the area of discomfort.  Denies injury.  He does have a brace at home that he occasionally wears.  He states it helps.  He likes to golf.    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     Physical Exam: Palpable pedal pulses right foot.  No edema or erythema or calor to right ankle.  POP right anterolateral ankle joint.  No crepitus with range of motion of the joint.  No pain on palpation of the peroneal tendons.  Assessment/Plan of Care: 1. Arthritis of right ankle     Discussed clinical findings with patient today.  With the patient's verbal consent, a corticosteroid injection was adminstered to the anterolateral aspect of right ankle.  This consisted of a mixture of 1% lidocaine  plain, 0.5% sensorcaine plain, and Kenalog -10 for a total of 1.25cc's administered.  Bandaid applied. Patient tolerated this well.     F/u prn    Awanda CHARM [place], DPM, FACFAS [facility]     [address] [place], [place] [address]                 Office [phone]  Fax [phone]

## 2024-01-19 DIAGNOSIS — B9689 Other specified bacterial agents as the cause of diseases classified elsewhere: Secondary | ICD-10-CM | POA: Diagnosis not present

## 2024-01-19 DIAGNOSIS — J019 Acute sinusitis, unspecified: Secondary | ICD-10-CM | POA: Diagnosis not present

## 2024-03-02 DIAGNOSIS — M5416 Radiculopathy, lumbar region: Secondary | ICD-10-CM | POA: Diagnosis not present

## 2024-03-09 DIAGNOSIS — M51379 Other intervertebral disc degeneration, lumbosacral region without mention of lumbar back pain or lower extremity pain: Secondary | ICD-10-CM | POA: Diagnosis not present

## 2024-03-09 DIAGNOSIS — M48061 Spinal stenosis, lumbar region without neurogenic claudication: Secondary | ICD-10-CM | POA: Diagnosis not present

## 2024-04-16 DIAGNOSIS — J301 Allergic rhinitis due to pollen: Secondary | ICD-10-CM | POA: Diagnosis not present

## 2024-05-19 DIAGNOSIS — H2513 Age-related nuclear cataract, bilateral: Secondary | ICD-10-CM | POA: Diagnosis not present

## 2024-05-19 DIAGNOSIS — H401122 Primary open-angle glaucoma, left eye, moderate stage: Secondary | ICD-10-CM | POA: Diagnosis not present

## 2024-05-20 ENCOUNTER — Other Ambulatory Visit: Payer: Self-pay | Admitting: Allergy and Immunology

## 2024-05-24 ENCOUNTER — Other Ambulatory Visit: Payer: Self-pay | Admitting: Allergy and Immunology

## 2024-05-26 DIAGNOSIS — R634 Abnormal weight loss: Secondary | ICD-10-CM | POA: Diagnosis not present

## 2024-05-26 DIAGNOSIS — J329 Chronic sinusitis, unspecified: Secondary | ICD-10-CM | POA: Diagnosis not present

## 2024-05-31 DIAGNOSIS — K581 Irritable bowel syndrome with constipation: Secondary | ICD-10-CM | POA: Diagnosis not present

## 2024-06-11 DIAGNOSIS — M5030 Other cervical disc degeneration, unspecified cervical region: Secondary | ICD-10-CM | POA: Diagnosis not present

## 2024-06-11 DIAGNOSIS — M4722 Other spondylosis with radiculopathy, cervical region: Secondary | ICD-10-CM | POA: Diagnosis not present

## 2024-06-11 DIAGNOSIS — R519 Headache, unspecified: Secondary | ICD-10-CM | POA: Diagnosis not present

## 2024-07-02 DIAGNOSIS — H53141 Visual discomfort, right eye: Secondary | ICD-10-CM | POA: Diagnosis not present

## 2024-07-02 DIAGNOSIS — R519 Headache, unspecified: Secondary | ICD-10-CM | POA: Diagnosis not present

## 2024-07-02 DIAGNOSIS — M4722 Other spondylosis with radiculopathy, cervical region: Secondary | ICD-10-CM | POA: Diagnosis not present

## 2024-07-02 DIAGNOSIS — M5030 Other cervical disc degeneration, unspecified cervical region: Secondary | ICD-10-CM | POA: Diagnosis not present

## 2024-07-02 DIAGNOSIS — J321 Chronic frontal sinusitis: Secondary | ICD-10-CM | POA: Diagnosis not present

## 2024-07-06 DIAGNOSIS — M5412 Radiculopathy, cervical region: Secondary | ICD-10-CM | POA: Diagnosis not present

## 2024-07-08 DIAGNOSIS — J342 Deviated nasal septum: Secondary | ICD-10-CM | POA: Diagnosis not present

## 2024-07-08 DIAGNOSIS — M47812 Spondylosis without myelopathy or radiculopathy, cervical region: Secondary | ICD-10-CM | POA: Diagnosis not present

## 2024-07-08 DIAGNOSIS — J45909 Unspecified asthma, uncomplicated: Secondary | ICD-10-CM | POA: Diagnosis not present

## 2024-07-08 DIAGNOSIS — J321 Chronic frontal sinusitis: Secondary | ICD-10-CM | POA: Diagnosis not present

## 2024-08-19 DIAGNOSIS — B880 Other acariasis: Secondary | ICD-10-CM | POA: Diagnosis not present

## 2024-09-15 DIAGNOSIS — S81802A Unspecified open wound, left lower leg, initial encounter: Secondary | ICD-10-CM | POA: Diagnosis not present

## 2024-09-21 DIAGNOSIS — L820 Inflamed seborrheic keratosis: Secondary | ICD-10-CM | POA: Diagnosis not present

## 2024-09-21 DIAGNOSIS — L570 Actinic keratosis: Secondary | ICD-10-CM | POA: Diagnosis not present

## 2024-10-15 ENCOUNTER — Ambulatory Visit (INDEPENDENT_AMBULATORY_CARE_PROVIDER_SITE_OTHER): Admitting: Podiatry

## 2024-10-15 DIAGNOSIS — M25571 Pain in right ankle and joints of right foot: Secondary | ICD-10-CM

## 2024-10-15 DIAGNOSIS — M19071 Primary osteoarthritis, right ankle and foot: Secondary | ICD-10-CM

## 2024-10-15 NOTE — Progress Notes (Signed)
 Chief Complaint  Patient presents with   Ankle Pain    R ankle dorsal last 2 weeks has been very painful. Had x rays this week at ortho in [place].  Not diabetic.  No anti coag   Discussed the use of AI scribe software for clinical note transcription with the patient, who gave verbal consent to proceed.  History of Present Illness Dennis Hoffman is an 83 year old male who presents with ankle pain and arthritis flare-up.  He is experiencing increased pain in his right ankle, for which he has previously received an injection to the anterolateral joint. The pain intensified after discontinuing ibuprofen, which he had been taking three times a day. He suspects that twisting his ankle may have contributed to the worsening of symptoms, although he is unsure of the exact circumstances of the twist.  He notes that the pain today is along the anteromedial right ankle  The pain is localized more on the inside of the ankle and is particularly noticeable when weight-bearing. He notes a sensation in the area, although it is not extremely painful.  He has a history of receiving injections for this condition and recalls being told he could have up to three injections in one spot per year. He is currently not taking any medication for the pain.  He does have ibuprofen 800 mg that he can take at home.  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  Mild pain on palpation to the anterolateral right ankle and moderate pain on palpation to the anteromedial right ankle.  No crepitus with range of motion of the ankle or subtalar joint.  No pain on palpation to the surrounding soft tissue structures and tendons on the medial or lateral side of the ankle.  No Achilles tendon pain.  No effusion is noted.  No calor or erythema is noted.  Palpable pedal pulses noted.  Results RADIOLOGY Ankle X-ray: Degenerative changes consistent with arthritis present.  No fracture seen.        Assessment/Plan of Care: 1. Pain in joint of right ankle   2. 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 Pain is localized to the medial aspect of the ankle, exacerbated by weight-bearing activities. Previous ibuprofen use was discontinued. Current management includes activity modification.  Reviewed the x-ray images that he showed me on his phone today.  Photos were taken of these for our charting purposes as well.  These were obtained on 10/11/2024 at an orthopedics office - Administered corticosteroid injection to the right ankle for pain relief.  This involved an anteromedial approach today.  Following a Betadine prep to the skin, a cortisone injection consisting of a mixture of 1% lidocaine  plain, 0.5% Marcaine plain and Kenalog  10 was administered to the anteromedial right ankle.  He tolerated this well and a Band-Aid was applied.  He was given an elastic ankle sleeve to wear while he golfs today. - Advised against hot tub use to prevent exacerbation of symptoms. - Provided an ankle sleeve for additional support during activities such as golf. - Educated on the limit of three corticosteroid injections per year  to avoid potential damage. - He has ibuprofen 800 mg that he takes 3-4 times daily as needed for pain  Follow-up as needed  Dodie Parisi CHARM [place], DPM, FACFAS [facility]     [address] [place], [place] [address]                Office [phone]  Fax [phone]
